# Patient Record
Sex: Female | Born: 1950 | Race: White | Hispanic: No | Smoking: Never smoker
Health system: Southern US, Community
[De-identification: ages and names within clinical notes are randomized; demographics above are authoritative.]

## PROBLEM LIST (undated history)

## (undated) DIAGNOSIS — E039 Hypothyroidism, unspecified: Secondary | ICD-10-CM

## (undated) DIAGNOSIS — E785 Hyperlipidemia, unspecified: Secondary | ICD-10-CM

## (undated) HISTORY — DX: Hyperlipidemia, unspecified: E78.5

## (undated) HISTORY — DX: Hypothyroidism, unspecified: E03.9

---

## 2018-01-27 LAB — RESULTS CONSOLE HPV: CHL HPV: NEGATIVE

## 2018-01-27 LAB — HM PAP SMEAR: HM Pap smear: NORMAL

## 2019-09-28 LAB — HM MAMMOGRAPHY

## 2019-11-06 HISTORY — PX: COLONOSCOPY: SHX174

## 2019-11-06 LAB — HM COLONOSCOPY

## 2020-09-02 ENCOUNTER — Encounter: Payer: Self-pay | Admitting: Medical

## 2020-09-02 ENCOUNTER — Ambulatory Visit (INDEPENDENT_AMBULATORY_CARE_PROVIDER_SITE_OTHER): Payer: Medicare Other | Admitting: Medical

## 2020-09-02 ENCOUNTER — Other Ambulatory Visit: Payer: Self-pay

## 2020-09-02 VITALS — BP 102/70 | HR 68 | Ht 60.0 in | Wt 109.8 lb

## 2020-09-02 DIAGNOSIS — E559 Vitamin D deficiency, unspecified: Secondary | ICD-10-CM | POA: Insufficient documentation

## 2020-09-02 DIAGNOSIS — E039 Hypothyroidism, unspecified: Secondary | ICD-10-CM | POA: Diagnosis not present

## 2020-09-02 DIAGNOSIS — K644 Residual hemorrhoidal skin tags: Secondary | ICD-10-CM

## 2020-09-02 DIAGNOSIS — R195 Other fecal abnormalities: Secondary | ICD-10-CM

## 2020-09-02 DIAGNOSIS — E785 Hyperlipidemia, unspecified: Secondary | ICD-10-CM | POA: Diagnosis not present

## 2020-09-02 HISTORY — DX: Other fecal abnormalities: R19.5

## 2020-09-02 MED ORDER — HYDROCORTISONE 2.5 % EX CREA: TOPICAL_CREAM | Freq: Two times a day (BID) | CUTANEOUS | 1 refills | Status: DC

## 2020-09-02 NOTE — Progress Notes (Signed)
Subjective:  Theresa Harrison is a 70 y.o. female who presents for Chief Complaint  Patient presents with  . Hypothyroidism     Here as a new patient today.    Moved from [place] back March 2022  Her sister in law lives here.   Lost brother in May of last year.   She was originally going to move back here anyway's, but he passed away last year.  Grew up in [place].  Son was stations in [place] in the past but he owned the land in [place].    Has hypothyroidism and her labs have varied quite a bit.  Been on thyroid medication in the 80s.   Currently on Levothyroxine .  Wants updated labs.  Hyperlipidemia - compliant with Lipitor 20mg  daily, been on this for years.  Takes vitamin D 5000 units daily.   Doesn't get a lot of weight bearing exercise.  She notes bad scoliosis , and does get pains in back and left hip bursitis.    Gets a lot of hip and back pain.  Uses tylenol arthritis occasionally by mouth.   Has hx/o hemorrhoids and prior banding.  Denies blood in stool.  Denies diarrhea, but sometimes has loose stools.  Doesn't always have formed stools.   Stool is more of a pudding consistency.    Had hand surgery in recent months and had bad reaction to ancef.  Wanted to make sure this is documented in records  No other aggravating or relieving factors.    No other c/o.  Past Medical History:  Diagnosis Date  . Hyperlipidemia   . Hypothyroid      Current Outpatient Medications on File Prior to Visit  Medication Sig Dispense Refill  . acetaminophen (TYLENOL) 500 MG tablet Take by mouth.    atorvastatin (LIPITOR) 20 MG tablet Take 1 tablet by mouth daily.    . Cholecalciferol (VITAMIN D-3) 5000 UNIT/ML LIQD Take by mouth.    . levothyroxine (SYNTHROID) 75 MCG tablet Take 75 mcg by mouth every morning.     No current facility-administered medications on file prior to visit.     The following portions of the patient's history were reviewed and  updated as appropriate: allergies, current medications, past family history, past medical history, past social history, past surgical history and problem list.  ROS Otherwise as in subjective above   Objective: BP 102/70   Pulse 68   Ht 5' (1.524 m)   Wt 109 lb 12.8 oz (49.8 kg)   SpO2 96%   BMI 21.44 kg/m   General appearance: alert, no distress, well developed, well nourished, white female Neck: supple, no lymphadenopathy, no thyromegaly, no masses Heart: RRR, normal S1, S2, no murmurs Lungs: CTA bilaterally, no wheezes, rhonchi, or rales Abdomen: +bs, soft, non tender, non distended, no masses, no hepatomegaly, no splenomegaly Significant obvious scoliosis with the right calcaneus that the Pulses: 2+ radial pulses, 2+ pedal pulses, normal cap refill Ext: no edema    Assessment: Encounter Diagnoses  Name Primary?  . Hypothyroidism (acquired) Yes  . Hyperlipidemia, unspecified hyperlipidemia type   . External hemorrhoid   . Vitamin D deficiency   . Change in consistency of stool      Plan: hypothyroidism -  discussed compliance, proper use of medication, and labs today  hyperlipidemia - c/t statin. Reviewed labs from 11/2019.   External hemorrhoids - c/t hot bath soaks, can use Hydrocortisone cream prn  Vit D deficiency - c/t supplement  Change in stool - likely related to diet.  Counseled on healthy diet with good fiber and water intake.   Reviewed 11/2019 colonoscopy report she brought showing internal and external hemorrhoids only.    I reviewed the records she brought in from prior including labs from August 2021, colonoscopy from August 2021, mammogram from June 2021  Leeona was seen today for hypothyroidism.  Diagnoses and all orders for this visit:  Hypothyroidism (acquired) -     TSH -     T4, free -     T3  Hyperlipidemia, unspecified hyperlipidemia type  External hemorrhoid  Vitamin D deficiency  Change in consistency of stool  Other  orders -     hydrocortisone 2.5 % cream; Apply topically 2 (two) times daily.    Follow up: pending labs

## 2020-09-03 ENCOUNTER — Encounter: Payer: Self-pay | Admitting: Medical

## 2020-09-03 ENCOUNTER — Other Ambulatory Visit: Payer: Self-pay | Admitting: Medical

## 2020-09-03 LAB — TSH: TSH: 4.97 u[IU]/mL — ABNORMAL HIGH (ref 0.450–4.500)

## 2020-09-03 LAB — T3: T3, Total: 101 ng/dL (ref 71–180)

## 2020-09-03 LAB — T4, FREE: Free T4: 1.23 ng/dL (ref 0.82–1.77)

## 2020-09-03 MED ORDER — LEVOTHYROXINE SODIUM 88 MCG PO TABS: 88.0000 ug | ORAL_TABLET | Freq: Every day | ORAL | 1 refills | Status: DC

## 2020-09-10 ENCOUNTER — Encounter: Payer: Self-pay | Admitting: Medical

## 2020-09-12 ENCOUNTER — Encounter: Payer: Self-pay | Admitting: Medical

## 2020-11-11 ENCOUNTER — Telehealth: Payer: Self-pay

## 2020-11-11 ENCOUNTER — Other Ambulatory Visit: Payer: Self-pay

## 2020-11-11 MED ORDER — ATORVASTATIN CALCIUM 20 MG PO TABS: 20.0000 mg | ORAL_TABLET | Freq: Every day | ORAL | 1 refills | Status: DC

## 2020-11-11 NOTE — Telephone Encounter (Signed)
Pt. Called stating she needs a refill on atorvastatin pt. Last apt was 09/02/20 and next apt is 12/25/20. She is a new pt. And he has not filled this for her before she did not need it filled at last apt.

## 2020-11-11 NOTE — Telephone Encounter (Signed)
Ok to refill this? I do not see updated lipids

## 2020-12-13 ENCOUNTER — Emergency Department (HOSPITAL_COMMUNITY): Payer: Medicare Other

## 2020-12-13 ENCOUNTER — Other Ambulatory Visit: Payer: Self-pay

## 2020-12-13 ENCOUNTER — Encounter (HOSPITAL_COMMUNITY): Payer: Self-pay | Admitting: Emergency Medicine

## 2020-12-13 ENCOUNTER — Emergency Department (HOSPITAL_COMMUNITY)
Admission: EM | Admit: 2020-12-13 | Discharge: 2020-12-13 | Disposition: A | Discharge status: Home / Self Care | Payer: Medicare Other | Attending: Emergency Medicine | Admitting: Emergency Medicine

## 2020-12-13 DIAGNOSIS — E039 Hypothyroidism, unspecified: Secondary | ICD-10-CM | POA: Insufficient documentation

## 2020-12-13 DIAGNOSIS — S0990XA Unspecified injury of head, initial encounter: Secondary | ICD-10-CM | POA: Insufficient documentation

## 2020-12-13 DIAGNOSIS — W06XXXA Fall from bed, initial encounter: Secondary | ICD-10-CM | POA: Insufficient documentation

## 2020-12-13 DIAGNOSIS — W19XXXA Unspecified fall, initial encounter: Secondary | ICD-10-CM

## 2020-12-13 DIAGNOSIS — R6884 Jaw pain: Secondary | ICD-10-CM | POA: Diagnosis not present

## 2020-12-13 IMAGING — CT CT MAXILLOFACIAL W/O CM
3 of 4 series · 16 of 47 positions shown, 19 images · non-contrast
Comparison: [DATE]

CLINICAL DATA: Fell out of bed, hit left side of head

EXAM:
CT MAXILLOFACIAL WITHOUT CONTRAST
TECHNIQUE: Multidetector CT imaging of the maxillofacial structures was
performed. Multiplanar CT image reconstructions were also generated.

[Series 3: facialbone 2.0 st · axial · 0.31mm/px · z∈[-222,-82]mm · 10 of 82 slices shown, 13 images]
[im 6/82  brain]
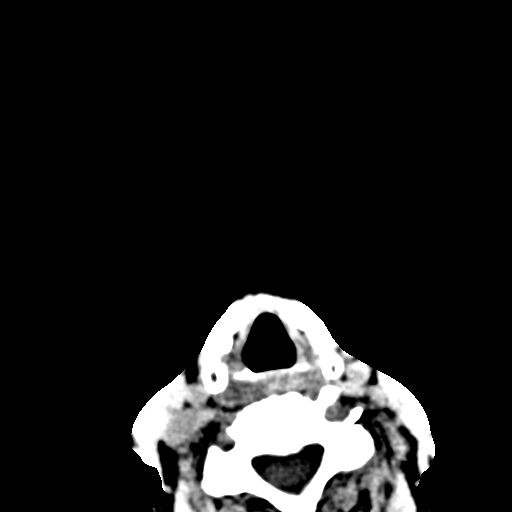
[im 6/82  bone]
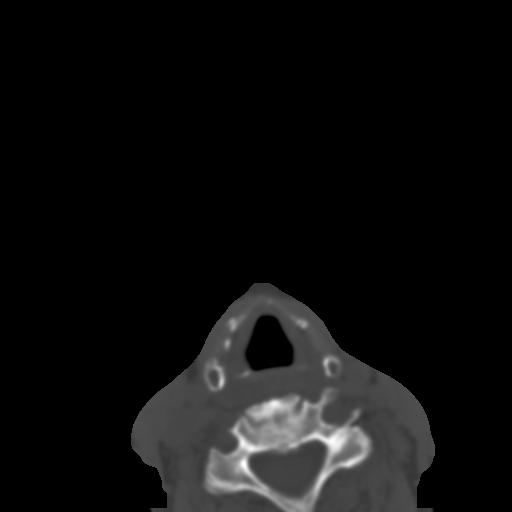
[im 14/82  bone]
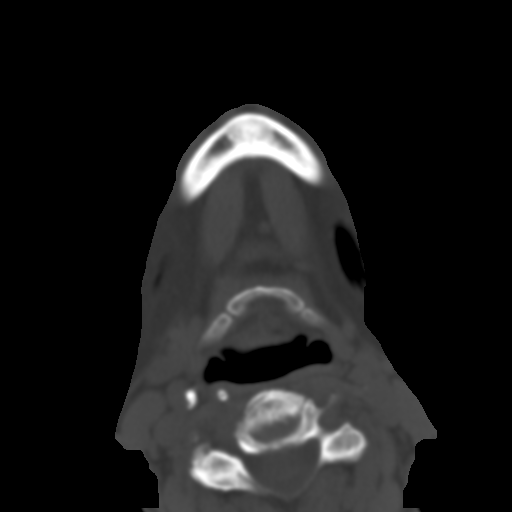
[im 23/82  bone]
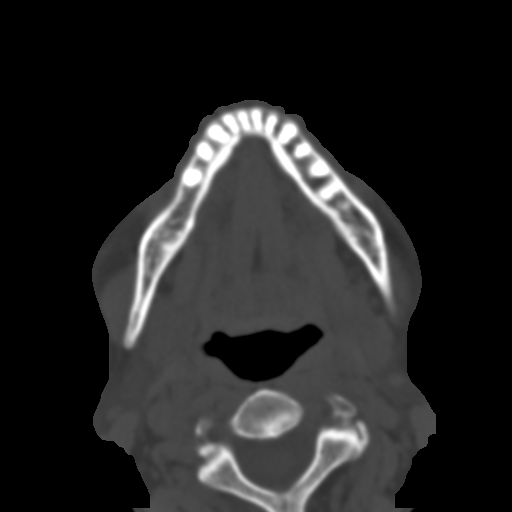
[im 28/82  bone]
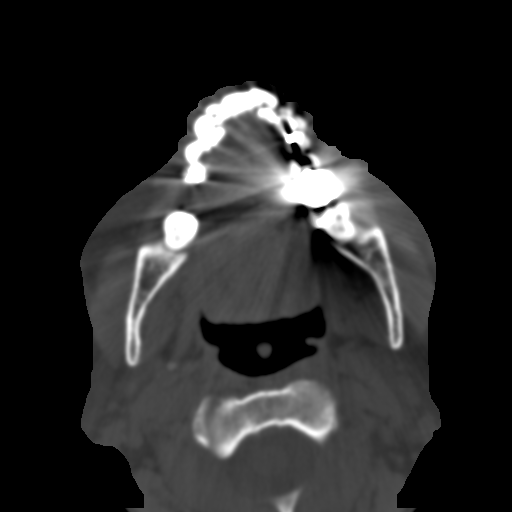
[im 37/82  brain]
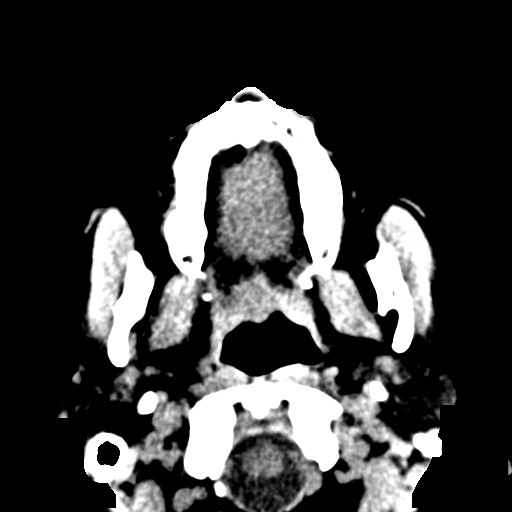
[im 37/82  bone]
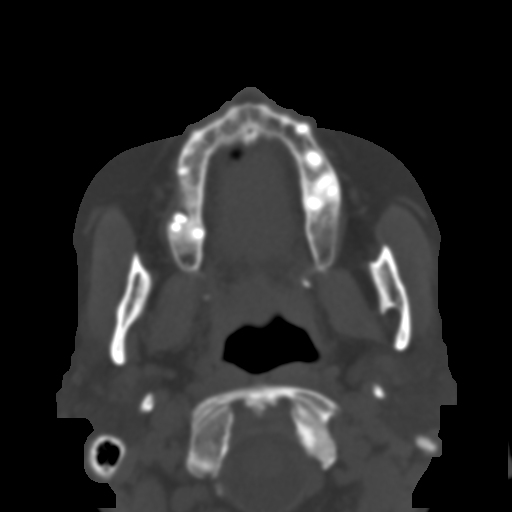
[im 45/82  bone]
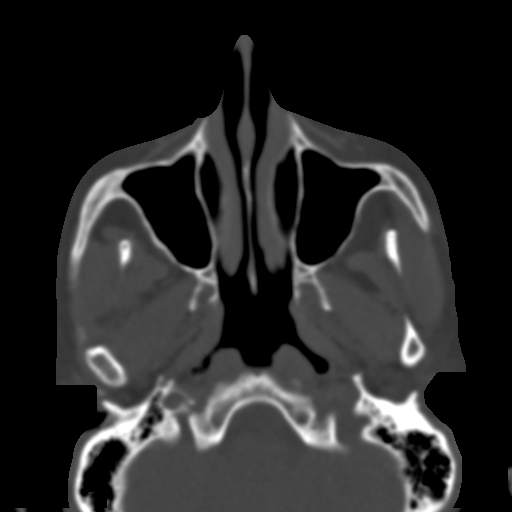
[im 54/82  bone]
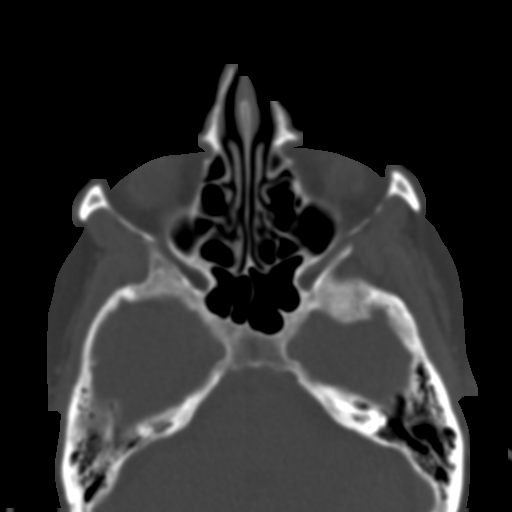
[im 62/82  bone]
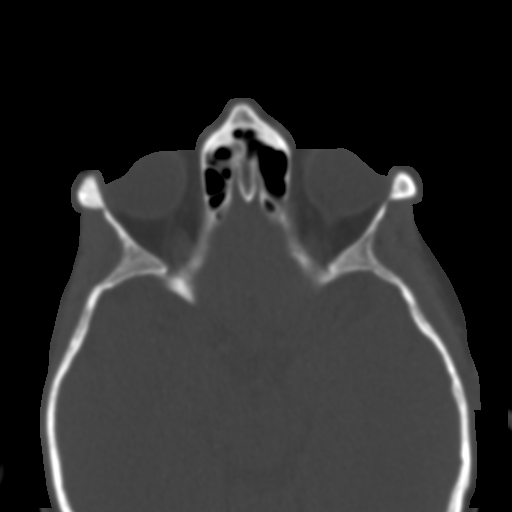
[im 68/82  brain]
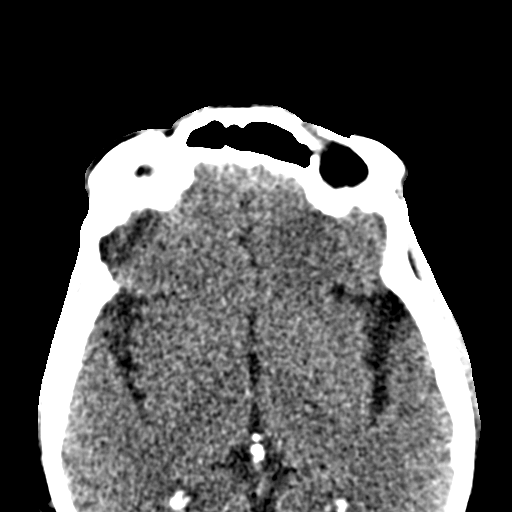
[im 68/82  bone]
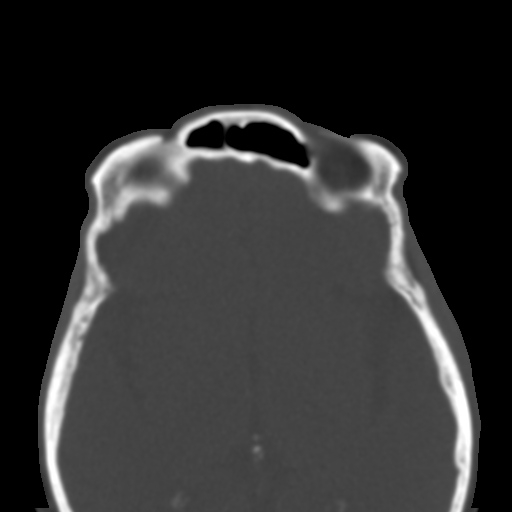
[im 76/82  bone]
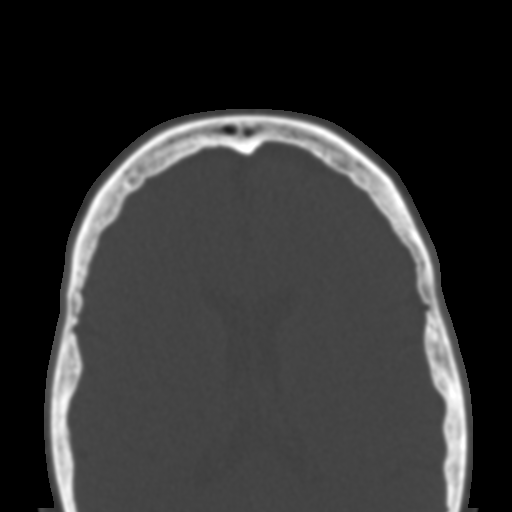

[Series 5: bone 2.0 cor · coronal · 0.38mm/px · 3 of 82 slices shown]
[im 21/82  bone]
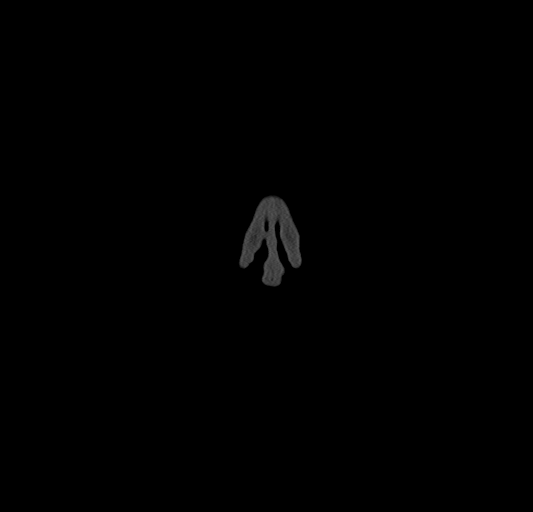
[im 41/82  bone]
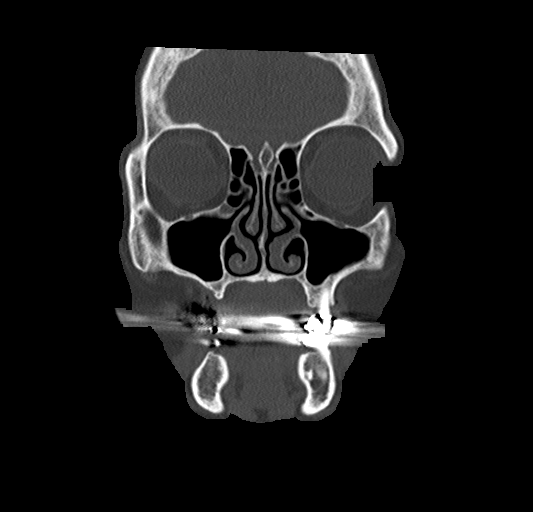
[im 61/82  bone]
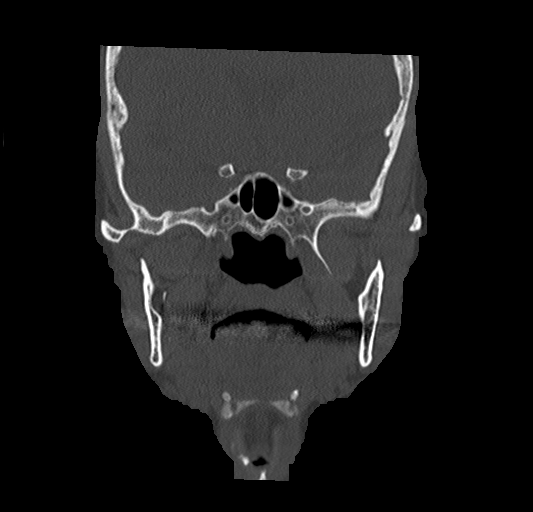

[Series 7: facialbone 2.0 sag st · sagittal · 0.34mm/px · 3 of 82 slices shown]
[im 28/82  bone]
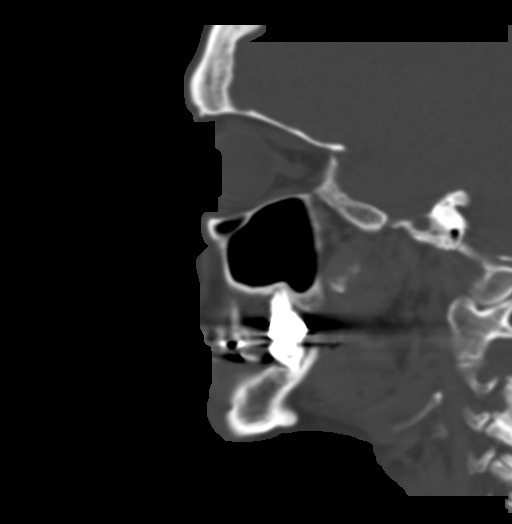
[im 41/82  bone]
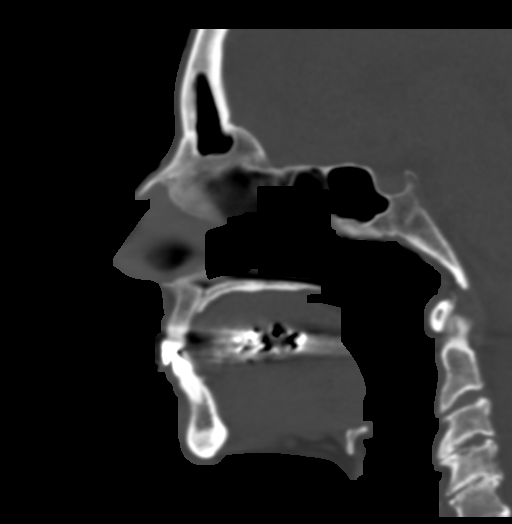
[im 55/82  bone]
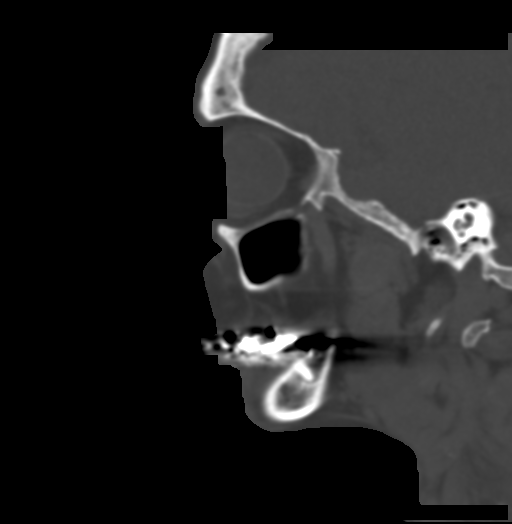

[16 of 47 positions shown; findings below may reference images not displayed]

FINDINGS: Osseous: No fracture or mandibular dislocation. No destructive
process.

Orbits: Negative. No traumatic or inflammatory finding.

Sinuses: Clear.

Soft tissues: Negative.

Limited intracranial: No significant or unexpected finding.
IMPRESSION: 1. No acute facial bone fracture.

## 2020-12-13 NOTE — Discharge Instructions (Signed)
You are seen emergency room today after a fall.  Your CT scans did not show any broken bones or bleeding.  You may take Tylenol as needed for pain and apply ice to the area that was tender.  Please follow closely with your primary care doctor.  Return to the emergency department any new or suddenly worsening symptoms.

## 2020-12-13 NOTE — ED Provider Notes (Signed)
Emergency Department Provider Note   I have reviewed the triage vital signs and the nursing notes.   HISTORY  Chief Complaint No chief complaint on file.   HPI Theresa Harrison is a 70 y.o. female with past medical history reviewed below presents emergency department with pain to the left side of the head and jaw after she rolled out of bed this morning.  Patient states the fall at a bed was mechanical.  She was supporting herself with her elbow and lying on her left side when the sheet slipped and she fell out of bed between 2 pieces of furniture.  She struck her head in the left temporal area and is having some pain in her jaw especially with touching or moving the area.  No clicking sensation.  No severe pain with eating or drinking.  She is not anticoagulated.  She not having pain in her neck or back. No numbness/weakness symptoms.    Past Medical History:  Diagnosis Date   Hyperlipidemia    Hypothyroid     Patient Active Problem List   Diagnosis Date Noted   Hypothyroidism (acquired) 09/02/2020   Hyperlipidemia 09/02/2020   External hemorrhoid 09/02/2020   Vitamin D deficiency 09/02/2020   Change in consistency of stool 09/02/2020    History reviewed. No pertinent surgical history.  Allergies Ancef [cefazolin] and Latex  No family history on file.  Social History Social History   Tobacco Use   Smoking status: Never   Smokeless tobacco: Never  Substance Use Topics   Alcohol use: Yes   Drug use: Never    Review of Systems  Constitutional: No fever/chills Eyes: No visual changes. ENT: No sore throat. Cardiovascular: Denies chest pain. Respiratory: Denies shortness of breath. Gastrointestinal: No abdominal pain.  No nausea, no vomiting.  No diarrhea.  No constipation. Genitourinary: Negative for dysuria. Musculoskeletal: Negative for back pain. Positive left jaw pain.  Skin: Negative for rash. Neurological: Negative for focal weakness or  numbness. Positive HA.   10-point ROS otherwise negative.  ____________________________________________   PHYSICAL EXAM:  VITAL SIGNS: ED Triage Vitals  Enc Vitals Group     BP 12/13/20 1211 (!) 152/80     Pulse Rate 12/13/20 1211 (!) 54     Resp 12/13/20 1211 18     Temp 12/13/20 1211 97.7 F (36.5 C)     Temp Source 12/13/20 1211 Oral     SpO2 12/13/20 1211 100 %     Weight 12/13/20 1628 112 lb (50.8 kg)     Height 12/13/20 1628 5' (1.524 m)   Constitutional: Alert and oriented. Well appearing and in no acute distress. Eyes: Conjunctivae are normal.  Head: Atraumatic appearance but some focal tenderness to the left temporal area.  Nose: No congestion/rhinnorhea. Mouth/Throat: Mucous membranes are moist.  Mild tenderness to palpation of the left lateral jaw.  Neck: No stridor. No cervical spine tenderness to palpation. Cardiovascular: Normal rate, regular rhythm. Good peripheral circulation. Grossly normal heart sounds.   Respiratory: Normal respiratory effort.  No retractions. Lungs CTAB. Gastrointestinal: Soft and nontender. No distention.  Musculoskeletal: No gross deformities of extremities. Neurologic:  Normal speech and language.  Skin:  Skin is warm, dry and intact. No rash noted.  ____________________________________________  RADIOLOGY  CT head and max-face reviewed.   ____________________________________________   PROCEDURES  Procedure(s) performed:   Procedures  None  ____________________________________________   INITIAL IMPRESSION / ASSESSMENT AND PLAN / ED COURSE  Pertinent labs & imaging results that were  available during my care of the patient were reviewed by me and considered in my medical decision making (see chart for details).   Patient presents to the emergency department for evaluation of left headache and jaw pain after fall out of bed.  Tenderness to palpation on exam but no outward signs of trauma.  Given patient's age do plan for CT  imaging of the head and max face given her lateral jaw discomfort. Will need to evaluate for fracture vs bleeding but low suspicion for both clinically.   CT imaging with no acute findings. Patient at mental status baseline. Vitals are WNL other than mildly elevated BP. Plan for return to home with PCP follow up plan.  ____________________________________________  FINAL CLINICAL IMPRESSION(S) / ED DIAGNOSES  Final diagnoses:  Injury of head, initial encounter  Jaw pain  Fall, initial encounter     Note:  This document was prepared using Dragon voice recognition software and may include unintentional dictation errors.  Alona Bene, MD, [facility] Emergency [facility]    Herson Prichard, Arlyss Repress, MD 12/17/20 0800

## 2020-12-13 NOTE — ED Triage Notes (Addendum)
Pt states she slid out of bed this morning and hit L side of head between ear and temporal area on nightstand.  Denies LOC.  Denies dizziness.  States she was reaching to get her phone with her R hand and L elbow slipped on sheet causing her to slide out of bed.  No blood thinners.

## 2020-12-13 NOTE — ED Notes (Signed)
E-signature pad unavailable at time of pt discharge. This RN discussed discharge materials with pt and answered all pt questions. Pt stated understanding of discharge material. ? ?

## 2020-12-15 ENCOUNTER — Telehealth: Payer: Self-pay

## 2020-12-15 NOTE — Telephone Encounter (Signed)
I called pt. Per pt. Ping report she went to the ER because she fell and hit her head. She said she doing fine and no f/u is needed. She also stated they did a scan of her head and everything checked out ok on that. She does have a MWV on 12/25/20.

## 2020-12-25 ENCOUNTER — Encounter: Payer: Self-pay | Admitting: Medical

## 2020-12-25 ENCOUNTER — Other Ambulatory Visit: Payer: Self-pay

## 2020-12-25 ENCOUNTER — Ambulatory Visit (INDEPENDENT_AMBULATORY_CARE_PROVIDER_SITE_OTHER): Payer: Medicare Other | Admitting: Medical

## 2020-12-25 VITALS — BP 120/72 | HR 60 | Ht 59.0 in | Wt 109.8 lb

## 2020-12-25 DIAGNOSIS — Z136 Encounter for screening for cardiovascular disorders: Secondary | ICD-10-CM | POA: Diagnosis not present

## 2020-12-25 DIAGNOSIS — E559 Vitamin D deficiency, unspecified: Secondary | ICD-10-CM | POA: Diagnosis not present

## 2020-12-25 DIAGNOSIS — K644 Residual hemorrhoidal skin tags: Secondary | ICD-10-CM

## 2020-12-25 DIAGNOSIS — Z7189 Other specified counseling: Secondary | ICD-10-CM

## 2020-12-25 DIAGNOSIS — E039 Hypothyroidism, unspecified: Secondary | ICD-10-CM | POA: Diagnosis not present

## 2020-12-25 DIAGNOSIS — E785 Hyperlipidemia, unspecified: Secondary | ICD-10-CM | POA: Diagnosis not present

## 2020-12-25 DIAGNOSIS — Z130 Encounter for screening for diseases of the blood and blood-forming organs and certain disorders involving the immune mechanism: Secondary | ICD-10-CM

## 2020-12-25 DIAGNOSIS — R159 Full incontinence of feces: Secondary | ICD-10-CM

## 2020-12-25 DIAGNOSIS — M810 Age-related osteoporosis without current pathological fracture: Secondary | ICD-10-CM | POA: Insufficient documentation

## 2020-12-25 DIAGNOSIS — Z780 Asymptomatic menopausal state: Secondary | ICD-10-CM | POA: Insufficient documentation

## 2020-12-25 DIAGNOSIS — Z0000 Encounter for general adult medical examination without abnormal findings: Secondary | ICD-10-CM | POA: Insufficient documentation

## 2020-12-25 DIAGNOSIS — R195 Other fecal abnormalities: Secondary | ICD-10-CM

## 2020-12-25 DIAGNOSIS — E2839 Other primary ovarian failure: Secondary | ICD-10-CM | POA: Insufficient documentation

## 2020-12-25 HISTORY — DX: Encounter for screening for cardiovascular disorders: Z13.6

## 2020-12-25 HISTORY — DX: Other specified counseling: Z71.89

## 2020-12-25 HISTORY — DX: Encounter for screening for diseases of the blood and blood-forming organs and certain disorders involving the immune mechanism: Z13.0

## 2020-12-25 NOTE — Patient Instructions (Signed)
This visit was a preventative care visit, also known as wellness visit or routine physical.   Topics typically include healthy lifestyle, diet, exercise, preventative care, vaccinations, sick and well care, proper use of emergency dept and after hours care, as well as other concerns.     Recommendations: Continue to return yearly for your annual wellness and preventative care visits.  This gives [place] a chance to discuss healthy lifestyle, exercise, vaccinations, review your chart record, and perform screenings where appropriate.  I recommend you see your eye doctor yearly for routine vision care.  I recommend you see your dentist yearly for routine dental care including hygiene visits twice yearly.   Vaccination recommendations were reviewed Immunization History  Administered Date(s) Administered   [employer] Vaccination 03/18/2020   Moderna Sars-Covid-2 Vaccination 05/08/2019, 06/05/2019   I recommend pneumococcal vaccine, flu vaccine, updated tetanus vaccine was greater than 10 years since last vaccine and shingles vaccine.  Please consider these    Screening for cancer: Colon cancer screening: I reviewed your colonoscopy on file that is up to date from 2021  Breast cancer screening: You should perform a self breast exam monthly.   We reviewed recommendations for regular mammograms and breast cancer screening.  Cervical cancer screening: We reviewed recommendations for pap smear screening.   Skin cancer screening: Check your skin regularly for new changes, growing lesions, or other lesions of concern Come in for evaluation if you have skin lesions of concern.  Lung cancer screening: If you have a greater than 20 pack year history of tobacco use, then you may qualify for lung cancer screening with a chest CT scan.   Please call your insurance company to inquire about coverage for this test.  We currently don't have screenings for other cancers besides breast,  cervical, colon, and lung cancers.  If you have a strong family history of cancer or have other cancer screening concerns, please let me know.    Bone health: Get at least 150 minutes of aerobic exercise weekly Get weight bearing exercise at least once weekly Bone density test:  A bone density test is an imaging test that uses a type of X-ray to measure the amount of calcium and other minerals in your bones. The test may be used to diagnose or screen you for a condition that causes weak or thin bones (osteoporosis), predict your risk for a broken bone (fracture), or determine how well your osteoporosis treatment is working. The bone density test is recommended for females 65 and older, or females or males <65 if certain risk factors such as thyroid disease, long term use of steroids such as for asthma or rheumatological issues, vitamin D deficiency, estrogen deficiency, family history of osteoporosis, self or family history of fragility fracture in first degree relative.    Heart health: Get at least 150 minutes of aerobic exercise weekly Limit alcohol It is important to maintain a healthy blood pressure and healthy cholesterol numbers  Heart disease screening: Screening for heart disease includes screening for blood pressure, fasting lipids, glucose/diabetes screening, BMI height to weight ratio, reviewed of smoking status, physical activity, and diet.    Goals include blood pressure 120/80 or less, maintaining a healthy lipid/cholesterol profile, preventing diabetes or keeping diabetes numbers under good control, not smoking or using tobacco products, exercising most days per week or at least 150 minutes per week of exercise, and eating healthy variety of fruits and vegetables, healthy oils, and avoiding unhealthy food choices like fried food,  fast food, high sugar and high cholesterol foods.    Other tests may possibly include EKG test, CT coronary calcium score, echocardiogram, exercise  treadmill stress test.     Medical care options: I recommend you continue to seek care here first for routine care.  We try really hard to have available appointments Monday through Friday daytime hours for sick visits, acute visits, and physicals.  Urgent care should be used for after hours and weekends for significant issues that cannot wait till the next day.  The emergency department should be used for significant potentially life-threatening emergencies.  The emergency department is expensive, can often have long wait times for less significant concerns, so try to utilize primary care, urgent care, or telemedicine when possible to avoid unnecessary trips to the emergency department.  Virtual visits and telemedicine have been introduced since the pandemic started in 2020, and can be convenient ways to receive medical care.  We offer virtual appointments as well to assist you in a variety of options to seek medical care.   Advanced Directives: I recommend you consider completing a Health Care Power of Attorney and Living Will.   These documents respect your wishes and help alleviate burdens on your loved ones if you were to become terminally ill or be in a position to need those documents enforced.    You can complete Advanced Directives yourself, have them notarized, then have copies made for our office, for you and for anybody you feel should have them in safe keeping.  Or, you can have an attorney prepare these documents.   If you haven't updated your Last Will and Testament in a while, it may be worthwhile having an attorney prepare these documents together and save on some costs.       Specific Concrens:  Osteoporosis Please call to schedule your bone density test  [facility]  [phone] [address] [place], [place] [address]  External hemorrhoids, fecal incontinence-referral to gastroenterology  Hyperlipidemia-continue current  medication, labs today  Hypothyroidism-continue current medications, labs today

## 2020-12-25 NOTE — Progress Notes (Signed)
Subjective:    Theresa Harrison is a 70 y.o. female who presents for Preventative Services visit and chronic medical problems/med check visit.   Chief Complaint  Patient presents with   fasting awv/ medcheck plus    Fasting awv, would like referrals-hemorrhoids, due for bone density- was on prolia but came off that due to side effect been 2-3 years since bone scan, declines flu shot    Primary Care Provider Tysinger, Kermit Balo, PA-C here for primary care  Current Health Care Team: Dentist, -none Eye doctor, Dr. Renaldo Fiddler- [place] [facility] [address]    Medical Services you may have received from other than [facility] providers in the past year (date may be approximate) none  Exercise Current exercise habits:  walking    Nutrition/Diet Current diet: in general, a "healthy" diet  , well balanced  Depression Screen Depression screen [facility] 2/9 12/25/2020  Decreased Interest 0  Down, Depressed, Hopeless 0  PHQ - 2 Score 0    Activities of Daily Living Screen/Functional Status Survey Is the patient deaf or have difficulty hearing?: No Does the patient have difficulty seeing, even when wearing glasses/contacts?: No Does the patient have difficulty concentrating, remembering, or making decisions?: No Does the patient have difficulty walking or climbing stairs?: No Does the patient have difficulty dressing or bathing?: No Does the patient have difficulty doing errands alone such as visiting a doctor's office or shopping?: No  Can patient draw a clock face showing 3:15 oclock, did 6CIT score and it was 0- which is great  Fall Risk Screen Fall Risk  12/25/2020 09/02/2020  Falls in the past year? 1 0  Number falls in past yr: 0 0  Injury with Fall? 1 0  Risk for fall due to : History of fall(s) No Fall Risks  Follow up Falls evaluation completed Falls evaluation completed    Gait Assessment: Normal gait observed yes  Advanced directives Does patient have a Health Care Power of  Attorney? No Does patient have a Living Will? Yes  Past Medical History:  Diagnosis Date   Hyperlipidemia    Hypothyroid     History reviewed. No pertinent surgical history.  Social History   Socioeconomic History   Marital status: Divorced    Spouse name: Not on file   Number of children: Not on file   Years of education: Not on file   Highest education level: Not on file  Occupational History   Not on file  Tobacco Use   Smoking status: Never   Smokeless tobacco: Never  Substance and Sexual Activity   Alcohol use: Not Currently   Drug use: Never   Sexual activity: Not on file  Other Topics Concern   Not on file  Social History Narrative   Lives alone.  Has 2 children, 2 adult sons.   Used to work in [employer], clinical for medical office for 38 years.   Walks for exercise.  12/2020.     Social Determinants of Health   Financial Resource Strain: Not on file  Food Insecurity: Not on file  Transportation Needs: Not on file  Physical Activity: Not on file  Stress: Not on file  Social Connections: Not on file  Intimate Partner Violence: Not on file    History reviewed. No pertinent family history.   Current Outpatient Medications:    acetaminophen (TYLENOL) 500 MG tablet, Take by mouth., Disp: , Rfl:    atorvastatin (LIPITOR) 20 MG tablet, Take 1 tablet (20 mg total) by  mouth daily., Disp: 90 tablet, Rfl: 1   hydrocortisone 2.5 % cream, Apply topically 2 (two) times daily., Disp: 30 g, Rfl: 1   levothyroxine (SYNTHROID) 88 MCG tablet, Take 1 tablet (88 mcg total) by mouth daily., Disp: 90 tablet, Rfl: 1  Allergies  Allergen Reactions   Ancef [Cefazolin]     Severe, hypotension   Latex Rash    History reviewed: allergies, current medications, past family history, past medical history, past social history, past surgical history and problem list  Chronic issues discussed: Ongoing problems with fecal leakages, hemorrhoids, mushy yellow seepage  from  rectum.    Acute issues discussed: none  Objective:      Biometrics BP 120/72   Pulse 60   Ht 4\' 11"  (1.499 m)   Wt 109 lb 12.8 oz (49.8 kg)   BMI 22.18 kg/m    Wt Readings from Last 3 Encounters:  12/25/20 109 lb 12.8 oz (49.8 kg)  12/13/20 112 lb (50.8 kg)  09/02/20 109 lb 12.8 oz (49.8 kg)   Alert and oriented x3, can perform simple calculations, recall is normal. geN: wd, wn, nad, white female HEENT: normocephalic, sclerae anicteric, TMs pearly, nares patent, no discharge or erythema, pharynx normal Oral cavity: MMM, no lesions Neck: supple, no lymphadenopathy, no thyromegaly, no masses, no bruits Heart: RRR, normal S1, S2, no murmurs Lungs: CTA bilaterally, no wheezes, rhonchi, or rales Abdomen: +bs, soft, non tender, non distended, no masses, no hepatomegaly, no splenomegaly Musculoskeletal: Obvious scoliosis with right side back raised higher than left, nontender, no swelling, no obvious deformity on the legs with normal range of motion, nontender, no deformity or swelling Extremities: no edema, no cyanosis, no clubbing Pulses: 2+ symmetric, upper and lower extremities, normal cap refill Neurological: alert, oriented x 3, CN2-12 intact, strength normal upper extremities and lower extremities, sensation normal throughout, DTRs 2+ throughout, no cerebellar signs, gait normal Psychiatric: normal affect, behavior normal, pleasant  Breast and pelvic declined, rectal with multiple areas of hemorrhoids and possible skin tag tissue some friable, abnormal exam, exam chaperoned by nurse    EKG indication: screen for heart disease, rate 52 bpm, PR 148 ms, QRS 76 ms, QTC 390 ms, axis 71 degrees, sinus bradycardia, possible left atrial argument    Assessment:   Encounter Diagnoses  Name Primary?   Hypothyroidism (acquired) Yes   Medicare annual wellness visit, subsequent    Vitamin D deficiency    Hyperlipidemia, unspecified hyperlipidemia type    External hemorrhoid     Change in consistency of stool    Screening for deficiency anemia    Incontinence of feces, unspecified fecal incontinence type    Screening for heart disease    Osteoporosis without current pathological fracture, unspecified osteoporosis type    Postmenopausal    Estrogen deficiency    Advanced directives, counseling/discussion      Plan:   A preventative services visit was completed today.  During the course of the visit today, we discussed and counseled about appropriate screening and preventive services.  A health risk assessment was established today that included a review of current medications, allergies, social history, family history, medical and preventative health history, biometrics, and preventative screenings to identify potential safety concerns or impairments.   This visit was a preventative care visit, also known as wellness visit or routine physical.   Topics typically include healthy lifestyle, diet, exercise, preventative care, vaccinations, sick and well care, proper use of emergency dept and after hours care, as well as other  concerns.     Recommendations: Continue to return yearly for your annual wellness and preventative care visits.  This gives [place] a chance to discuss healthy lifestyle, exercise, vaccinations, review your chart record, and perform screenings where appropriate.  I recommend you see your eye doctor yearly for routine vision care.  I recommend you see your dentist yearly for routine dental care including hygiene visits twice yearly.   Vaccination recommendations were reviewed Immunization History  Administered Date(s) Administered   [employer] Vaccination 03/18/2020   Moderna Sars-Covid-2 Vaccination 05/08/2019, 06/05/2019   I recommend pneumococcal vaccine, flu vaccine, updated tetanus vaccine was greater than 10 years since last vaccine and shingles vaccine.  Please consider these    Screening for cancer: Colon cancer  screening: I reviewed your colonoscopy on file that is up to date from 2021  Breast cancer screening: You should perform a self breast exam monthly.   We reviewed recommendations for regular mammograms and breast cancer screening.  Cervical cancer screening: We reviewed recommendations for pap smear screening.   Skin cancer screening: Check your skin regularly for new changes, growing lesions, or other lesions of concern Come in for evaluation if you have skin lesions of concern.  Lung cancer screening: If you have a greater than 20 pack year history of tobacco use, then you may qualify for lung cancer screening with a chest CT scan.   Please call your insurance company to inquire about coverage for this test.  We currently don't have screenings for other cancers besides breast, cervical, colon, and lung cancers.  If you have a strong family history of cancer or have other cancer screening concerns, please let me know.    Bone health: Get at least 150 minutes of aerobic exercise weekly Get weight bearing exercise at least once weekly Bone density test:  A bone density test is an imaging test that uses a type of X-ray to measure the amount of calcium and other minerals in your bones. The test may be used to diagnose or screen you for a condition that causes weak or thin bones (osteoporosis), predict your risk for a broken bone (fracture), or determine how well your osteoporosis treatment is working. The bone density test is recommended for females 65 and older, or females or males <65 if certain risk factors such as thyroid disease, long term use of steroids such as for asthma or rheumatological issues, vitamin D deficiency, estrogen deficiency, family history of osteoporosis, self or family history of fragility fracture in first degree relative.    Heart health: Get at least 150 minutes of aerobic exercise weekly Limit alcohol It is important to maintain a healthy blood pressure and  healthy cholesterol numbers  Heart disease screening: Screening for heart disease includes screening for blood pressure, fasting lipids, glucose/diabetes screening, BMI height to weight ratio, reviewed of smoking status, physical activity, and diet.    Goals include blood pressure 120/80 or less, maintaining a healthy lipid/cholesterol profile, preventing diabetes or keeping diabetes numbers under good control, not smoking or using tobacco products, exercising most days per week or at least 150 minutes per week of exercise, and eating healthy variety of fruits and vegetables, healthy oils, and avoiding unhealthy food choices like fried food, fast food, high sugar and high cholesterol foods.    Other tests may possibly include EKG test, CT coronary calcium score, echocardiogram, exercise treadmill stress test.     Medical care options: I recommend you continue to seek care here first for  routine care.  We try really hard to have available appointments Monday through Friday daytime hours for sick visits, acute visits, and physicals.  Urgent care should be used for after hours and weekends for significant issues that cannot wait till the next day.  The emergency department should be used for significant potentially life-threatening emergencies.  The emergency department is expensive, can often have long wait times for less significant concerns, so try to utilize primary care, urgent care, or telemedicine when possible to avoid unnecessary trips to the emergency department.  Virtual visits and telemedicine have been introduced since the pandemic started in 2020, and can be convenient ways to receive medical care.  We offer virtual appointments as well to assist you in a variety of options to seek medical care.   Advanced Directives: I recommend you consider completing a Health Care Power of Attorney and Living Will.   These documents respect your wishes and help alleviate burdens on your loved ones if  you were to become terminally ill or be in a position to need those documents enforced.    You can complete Advanced Directives yourself, have them notarized, then have copies made for our office, for you and for anybody you feel should have them in safe keeping.  Or, you can have an attorney prepare these documents.   If you haven't updated your Last Will and Testament in a while, it may be worthwhile having an attorney prepare these documents together and save on some costs.       Specific Concrens:  Osteoporosis Please call to schedule your bone density test  [facility]  [phone] [address] [place], [place] [address]  External hemorrhoids, fecal incontinence-referral to gastroenterology  Hyperlipidemia-continue current medication, labs today  Hypothyroidism-continue current medications, labs today        Theresa Harrison was seen today for fasting awv/ medcheck plus.  Diagnoses and all orders for this visit:  Hypothyroidism (acquired) -     Comprehensive metabolic panel -     CBC with Differential/Platelet -     TSH + free T4 -     DG Bone Density; Future  Medicare annual wellness visit, subsequent  Vitamin D deficiency -     CBC with Differential/Platelet -     VITAMIN D 25 Hydroxy (Vit-D Deficiency, Fractures)  Hyperlipidemia, unspecified hyperlipidemia type -     Comprehensive metabolic panel -     Lipid panel  External hemorrhoid -     Ambulatory referral to Gastroenterology  Change in consistency of stool  Screening for deficiency anemia -     CBC with Differential/Platelet  Incontinence of feces, unspecified fecal incontinence type  Screening for heart disease -     EKG 12-Lead  Osteoporosis without current pathological fracture, unspecified osteoporosis type -     DG Bone Density; Future  Postmenopausal -     DG Bone Density; Future  Estrogen deficiency -     DG Bone Density; Future  Advanced  directives, counseling/discussion     Medicare Attestation A preventative services visit was completed today.  During the course of the visit the patient was educated and counseled about appropriate screening and preventive services.  A health risk assessment was established with the patient that included a review of current medications, allergies, social history, family history, medical and preventative health history, biometrics, and preventative screenings to identify potential safety concerns or impairments.  A personalized plan was printed today for the patient's  records and use.   Personalized health advice and education was given today to reduce health risks and promote self management and wellness.  Information regarding end of life planning was discussed today.  Kristian Covey, PA-C   12/25/2020

## 2020-12-26 ENCOUNTER — Other Ambulatory Visit: Payer: Self-pay | Admitting: Medical

## 2020-12-26 LAB — COMPREHENSIVE METABOLIC PANEL
ALT: 11 IU/L (ref 0–32)
AST: 23 IU/L (ref 0–40)
Albumin/Globulin Ratio: 2.7 — ABNORMAL HIGH (ref 1.2–2.2)
Albumin: 4.9 g/dL — ABNORMAL HIGH (ref 3.7–4.7)
Alkaline Phosphatase: 99 IU/L (ref 44–121)
BUN/Creatinine Ratio: 14 (ref 12–28)
BUN: 10 mg/dL (ref 8–27)
Bilirubin Total: 0.5 mg/dL (ref 0.0–1.2)
CO2: 26 mmol/L (ref 20–29)
Calcium: 10.3 mg/dL (ref 8.7–10.3)
Chloride: 103 mmol/L (ref 96–106)
Creatinine, Ser: 0.7 mg/dL (ref 0.57–1.00)
Globulin, Total: 1.8 g/dL (ref 1.5–4.5)
Glucose: 97 mg/dL (ref 65–99)
Potassium: 4.2 mmol/L (ref 3.5–5.2)
Sodium: 143 mmol/L (ref 134–144)
Total Protein: 6.7 g/dL (ref 6.0–8.5)
eGFR: 92 mL/min/{1.73_m2} (ref >59)

## 2020-12-26 LAB — LIPID PANEL
Chol/HDL Ratio: 2.5 ratio (ref 0.0–4.4)
Cholesterol, Total: 203 mg/dL — ABNORMAL HIGH (ref 100–199)
HDL: 82 mg/dL (ref >39)
LDL Chol Calc (NIH): 97 mg/dL (ref 0–99)
Triglycerides: 142 mg/dL (ref 0–149)
VLDL Cholesterol Cal: 24 mg/dL (ref 5–40)

## 2020-12-26 LAB — CBC WITH DIFFERENTIAL/PLATELET
Basophils Absolute: 0.1 10*3/uL (ref 0.0–0.2)
Basos: 1 %
EOS (ABSOLUTE): 0 10*3/uL (ref 0.0–0.4)
Eos: 1 %
Hematocrit: 43.6 % (ref 34.0–46.6)
Hemoglobin: 14.7 g/dL (ref 11.1–15.9)
Immature Grans (Abs): 0 10*3/uL (ref 0.0–0.1)
Immature Granulocytes: 0 %
Lymphocytes Absolute: 1.9 10*3/uL (ref 0.7–3.1)
Lymphs: 28 %
MCH: 32 pg (ref 26.6–33.0)
MCHC: 33.7 g/dL (ref 31.5–35.7)
MCV: 95 fL (ref 79–97)
Monocytes Absolute: 0.5 10*3/uL (ref 0.1–0.9)
Monocytes: 7 %
Neutrophils Absolute: 4.5 10*3/uL (ref 1.4–7.0)
Neutrophils: 63 %
Platelets: 239 10*3/uL (ref 150–450)
RBC: 4.6 x10E6/uL (ref 3.77–5.28)
RDW: 12.1 % (ref 11.7–15.4)
WBC: 7 10*3/uL (ref 3.4–10.8)

## 2020-12-26 LAB — TSH+FREE T4
Free T4: 1.82 ng/dL — ABNORMAL HIGH (ref 0.82–1.77)
TSH: 0.139 u[IU]/mL — ABNORMAL LOW (ref 0.450–4.500)

## 2020-12-26 LAB — VITAMIN D 25 HYDROXY (VIT D DEFICIENCY, FRACTURES): Vit D, 25-Hydroxy: 74.5 ng/mL (ref 30.0–100.0)

## 2020-12-26 MED ORDER — LEVOTHYROXINE SODIUM 75 MCG PO TABS: 75.0000 ug | ORAL_TABLET | Freq: Every day | ORAL | 1 refills | Status: DC

## 2021-01-08 ENCOUNTER — Other Ambulatory Visit: Payer: Self-pay

## 2021-01-08 ENCOUNTER — Ambulatory Visit
Admission: RE | Admit: 2021-01-08 | Discharge: 2021-01-08 | Disposition: A | Discharge status: Home / Self Care | Payer: Medicare Other | Source: Ambulatory Visit | Attending: Medical | Admitting: Medical

## 2021-01-08 DIAGNOSIS — Z780 Asymptomatic menopausal state: Secondary | ICD-10-CM

## 2021-01-08 DIAGNOSIS — M810 Age-related osteoporosis without current pathological fracture: Secondary | ICD-10-CM

## 2021-01-08 DIAGNOSIS — E2839 Other primary ovarian failure: Secondary | ICD-10-CM

## 2021-01-08 DIAGNOSIS — E039 Hypothyroidism, unspecified: Secondary | ICD-10-CM

## 2021-01-14 ENCOUNTER — Other Ambulatory Visit: Payer: Self-pay | Admitting: Medical

## 2021-01-14 MED ORDER — ALENDRONATE SODIUM 70 MG PO TABS: 70.0000 mg | ORAL_TABLET | ORAL | 1 refills | Status: DC

## 2021-01-15 ENCOUNTER — Other Ambulatory Visit: Payer: Self-pay | Admitting: Internal Medicine

## 2021-01-15 DIAGNOSIS — M810 Age-related osteoporosis without current pathological fracture: Secondary | ICD-10-CM

## 2021-01-28 ENCOUNTER — Other Ambulatory Visit: Payer: Self-pay

## 2021-01-28 ENCOUNTER — Ambulatory Visit (INDEPENDENT_AMBULATORY_CARE_PROVIDER_SITE_OTHER): Payer: Medicare Other

## 2021-01-28 ENCOUNTER — Telehealth: Payer: Self-pay | Admitting: Internal Medicine

## 2021-01-28 DIAGNOSIS — Z23 Encounter for immunization: Secondary | ICD-10-CM | POA: Diagnosis not present

## 2021-01-28 NOTE — Progress Notes (Signed)
Per shane. Ok to give moderna covid early since flu shot was last week

## 2021-01-28 NOTE — Telephone Encounter (Signed)
Pt wants to come in in 2 weeks for pneumonia shot. Can you advise which one to give

## 2021-01-29 NOTE — Telephone Encounter (Signed)
Noted when she schedules we will know what she gets

## 2021-02-10 ENCOUNTER — Other Ambulatory Visit: Payer: Self-pay | Admitting: Medical

## 2021-02-12 ENCOUNTER — Other Ambulatory Visit: Payer: Self-pay

## 2021-02-12 ENCOUNTER — Other Ambulatory Visit (INDEPENDENT_AMBULATORY_CARE_PROVIDER_SITE_OTHER): Payer: Medicare Other

## 2021-02-12 DIAGNOSIS — Z23 Encounter for immunization: Secondary | ICD-10-CM | POA: Diagnosis not present

## 2021-02-27 ENCOUNTER — Other Ambulatory Visit: Payer: Self-pay | Admitting: Medical

## 2021-03-11 ENCOUNTER — Other Ambulatory Visit: Payer: Self-pay | Admitting: Medical

## 2021-04-09 ENCOUNTER — Other Ambulatory Visit: Payer: Self-pay | Admitting: Medical

## 2021-04-22 ENCOUNTER — Other Ambulatory Visit: Payer: Self-pay | Admitting: Medical

## 2021-04-22 ENCOUNTER — Telehealth: Payer: Self-pay | Admitting: Medical

## 2021-04-22 MED ORDER — LEVOTHYROXINE SODIUM 75 MCG PO TABS: 75.0000 ug | ORAL_TABLET | Freq: Every day | ORAL | 1 refills | Status: DC

## 2021-04-22 NOTE — Telephone Encounter (Signed)
sent 

## 2021-04-22 NOTE — Telephone Encounter (Signed)
Pt called and is requesting a refill on her synthroid 75mg  please send to the [facility] #[id number] [address], [place]

## 2021-05-08 ENCOUNTER — Other Ambulatory Visit: Payer: Self-pay | Admitting: Medical

## 2021-05-12 NOTE — Telephone Encounter (Signed)
Referred to a different endocrinologist in town if she is okay with this.  Try [employer]

## 2021-06-30 ENCOUNTER — Other Ambulatory Visit: Payer: Self-pay

## 2021-06-30 ENCOUNTER — Encounter: Payer: Self-pay | Admitting: Medical

## 2021-06-30 ENCOUNTER — Ambulatory Visit (INDEPENDENT_AMBULATORY_CARE_PROVIDER_SITE_OTHER): Payer: Medicare Other | Admitting: Medical

## 2021-06-30 VITALS — BP 120/70 | HR 57 | Wt 116.4 lb

## 2021-06-30 DIAGNOSIS — E039 Hypothyroidism, unspecified: Secondary | ICD-10-CM

## 2021-06-30 DIAGNOSIS — R899 Unspecified abnormal finding in specimens from other organs, systems and tissues: Secondary | ICD-10-CM

## 2021-06-30 DIAGNOSIS — M810 Age-related osteoporosis without current pathological fracture: Secondary | ICD-10-CM | POA: Diagnosis not present

## 2021-06-30 DIAGNOSIS — E785 Hyperlipidemia, unspecified: Secondary | ICD-10-CM | POA: Diagnosis not present

## 2021-06-30 HISTORY — DX: Unspecified abnormal finding in specimens from other organs, systems and tissues: R89.9

## 2021-06-30 NOTE — Patient Instructions (Signed)
Synthroid name brand thyroid medication is what we prefer due to inconsistencies with generic levothyroxine regarding symptoms and effectiveness of the medication.   One cheaper option for getting name brand Synthroid is a Health visitor order pharmacy called [facility] in [place].    ? ? ?

## 2021-06-30 NOTE — Progress Notes (Signed)
Subjective: ? Theresa Harrison is a 71 y.o. female who presents for ?Chief Complaint  ?Patient presents with  ? follow-up  ?  Follow-up on Thyroid medication. But would like atorvastatin  #90   ?   ?Here for med check ? ?Here for rehceck on thyroid medication.    There apparently was confusion about dosing last visit in September 2022.  We had written down that she was taking 88 mcg daily but she notes that somehow that was incorrect and she has actually been on 75 mcg regularly.  She is taking generic levothyroxine.  She feels fine in general.  The highest that she has ever been on is 88 mcg in the past. ? ?Osteoporosis-last visit we made a referral but the office apparently is no longer taking patients.  She has been on Fosamax and Prolia in the past, has also been on Reclast in the past.  She had lost some teeth while on Prolia so she does not want to use any bisphosphonate due to risk of osteonecrosis of the jaw.  She had gastric issues with Fosamax. ? ?She is a non-smoker ? ?She has chronic dry eyes, she is on therapy parotid ductal ? ?She saw her spine specialist recently. ? ?Wants 90 day supply on both cholesterol and thyroid medication. ? ?Having some sneezing and allergy issues, mild.  ? ?No other aggravating or relieving factors.   ? ?No other c/o. ? ?Past Medical History:  ?Diagnosis Date  ? Hyperlipidemia   ? Hypothyroid   ? ?Current Outpatient Medications on File Prior to Visit  ?Medication Sig Dispense Refill  ? acetaminophen (TYLENOL) 500 MG tablet Take by mouth.    ? atorvastatin (LIPITOR) 20 MG tablet TAKE 1 TABLET BY MOUTH EVERY DAY 90 tablet 1  ? levothyroxine (SYNTHROID) 75 MCG tablet Take 1 tablet (75 mcg total) by mouth daily. 30 tablet 1  ? hydrocortisone 2.5 % cream Apply topically 2 (two) times daily. (Patient not taking: Reported on 06/30/2021) 30 g 1  ? ?No current facility-administered medications on file prior to visit.  ? ? ? ?The following portions of the patient's history were  reviewed and updated as appropriate: allergies, current medications, past family history, past medical history, past social history, past surgical history and problem list. ? ?ROS ?Otherwise as in subjective above ? ? ? ?Objective: ?BP 120/70   Pulse (!) 57   Wt 116 lb 6.4 oz (52.8 kg)   BMI 23.51 kg/m?  ? ?Wt Readings from Last 3 Encounters:  ?06/30/21 116 lb 6.4 oz (52.8 kg)  ?12/25/20 109 lb 12.8 oz (49.8 kg)  ?12/13/20 112 lb (50.8 kg)  ? ?BP Readings from Last 3 Encounters:  ?06/30/21 120/70  ?12/25/20 120/72  ?12/13/20 (!) 142/93  ? ?General appearance: alert, no distress, well developed, well nourished ? ? ? ?Assessment: ?Encounter Diagnoses  ?Name Primary?  ? Hypothyroidism (acquired) Yes  ? Hyperlipidemia, unspecified hyperlipidemia type   ? Abnormal laboratory test   ? ? ? ?Plan: ?Hypothyroidism-last visit we had taken an information that she was on levothyroxine 88 mcg daily.  When in fact she was on 75 mcg daily.  So that she actually has been on the same dose since last visit.  Recheck labs today but likely will need to lower dose to 50 mcg daily.  I gave her a sample of 50 mcg Synthroid so she is not completely out of her medication. ? ?Hyperlipidemia-continue statin ? ?Elevated albumin on labs back in September.  Recheck labs today. ? ?Osteoporosis-discussed options for therapy.  We made a referral to endocrinology last visit but the office in which we referred to apparently is not taking the patient's although they initially led her to believe they were.  She will look over the handout I gave today on treatment options.  She has been on Fosamax and Prolia in the past but does not want to use a bisphosphonate due to osteonecrosis risk.  She wants to use some other treatment.  She is also been on Reclast infusion in the past.  We discussed importance of weightbearing and aerobic exercise. ? ? ?Theresa Harrison was seen today for follow-up. ? ?Diagnoses and all orders for this visit: ? ?Hypothyroidism  (acquired) ?-     TSH + free T4 ? ?Hyperlipidemia, unspecified hyperlipidemia type ?-     Hepatic function panel ? ?Abnormal laboratory test ?-     Hepatic function panel ? ? ? ?Follow up: pending labs ? ? ?

## 2021-07-01 ENCOUNTER — Other Ambulatory Visit: Payer: Self-pay | Admitting: Medical

## 2021-07-01 LAB — TSH+FREE T4
Free T4: 1.34 ng/dL (ref 0.82–1.77)
TSH: 2.38 u[IU]/mL (ref 0.450–4.500)

## 2021-07-01 LAB — HEPATIC FUNCTION PANEL
ALT: 14 IU/L (ref 0–32)
AST: 25 IU/L (ref 0–40)
Albumin: 4.8 g/dL — ABNORMAL HIGH (ref 3.7–4.7)
Alkaline Phosphatase: 98 IU/L (ref 44–121)
Bilirubin Total: 0.4 mg/dL (ref 0.0–1.2)
Bilirubin, Direct: 0.11 mg/dL (ref 0.00–0.40)
Total Protein: 6.7 g/dL (ref 6.0–8.5)

## 2021-07-01 MED ORDER — ATORVASTATIN CALCIUM 20 MG PO TABS: 20.0000 mg | ORAL_TABLET | Freq: Every day | ORAL | 3 refills | Status: DC

## 2021-07-01 MED ORDER — LEVOTHYROXINE SODIUM 75 MCG PO TABS: 75.0000 ug | ORAL_TABLET | Freq: Every day | ORAL | 3 refills | Status: DC

## 2021-07-02 ENCOUNTER — Other Ambulatory Visit: Payer: Self-pay | Admitting: Medical

## 2021-07-02 MED ORDER — LEVOTHYROXINE SODIUM 75 MCG PO TABS: 75.0000 ug | ORAL_TABLET | Freq: Every day | ORAL | 3 refills | Status: DC

## 2021-07-02 MED ORDER — ATORVASTATIN CALCIUM 20 MG PO TABS
20.0000 mg | ORAL_TABLET | Freq: Every day | ORAL | 3 refills | Status: DC
Start: 2021-07-02 — End: 2022-05-19

## 2021-08-19 NOTE — Congregational Nurse Program (Signed)
?  Dept: [phone] ? ? ?Congregational Nurse Program Note ? ?Date of Encounter: 08/19/2021 ?BP 133/72  ?Patient requested bp check ? ?Past Medical History: ?Past Medical History:  ?Diagnosis Date  ? Hyperlipidemia   ? Hypothyroid   ? ? ?Encounter Details: ? CNP Questionnaire - 08/19/21 1628   ? ?  ? Questionnaire  ? Do you give verbal consent to treat you today? Yes   ? Location Patient [place]   ? Visit Setting Church or Organization   ? Insurance Referral N/A   ? Medication N/A   ? Medical Provider Yes   ? Medical Referral N/A   ? Food N/A   ? Transportation N/A   ? Housing/Utilities N/A   ? Interpersonal Safety N/A   ? Intervention Blood pressure   ? ED Visit Averted N/A   ? Life-Saving Intervention Made N/A   ? ?  ?  ? ?  ? ? ?Estanislado Pandy, RN,  ? ?

## 2021-10-12 ENCOUNTER — Ambulatory Visit (INDEPENDENT_AMBULATORY_CARE_PROVIDER_SITE_OTHER): Payer: Medicare Other | Admitting: Medical

## 2021-10-12 VITALS — BP 120/80 | HR 60 | Wt 113.8 lb

## 2021-10-12 DIAGNOSIS — R42 Dizziness and giddiness: Secondary | ICD-10-CM | POA: Insufficient documentation

## 2021-10-12 DIAGNOSIS — E039 Hypothyroidism, unspecified: Secondary | ICD-10-CM | POA: Diagnosis not present

## 2021-10-12 DIAGNOSIS — R001 Bradycardia, unspecified: Secondary | ICD-10-CM

## 2021-10-12 DIAGNOSIS — H6121 Impacted cerumen, right ear: Secondary | ICD-10-CM

## 2021-10-12 HISTORY — DX: Dizziness and giddiness: R42

## 2021-10-12 NOTE — Progress Notes (Signed)
Subjective:  Theresa Harrison is a 71 y.o. female who presents for Chief Complaint  Patient presents with   Acute Visit    Light headed and dizzy, mainly when she gets up. Not sure if its low blood sugar or blood pressure. She stated that she is also dehydrated.      Here for concerns for lightheadedness when she scheduled the appt a few weeks ago.   Getting up from cough or salon chair will feel lightheaded. However, this past week she has felt fine.  Feels like she is drinking plenty of fluids and water.  Been using some Gatorade hydration drinks.   Is exercising as usual.  She notes that she nibbles, not 3 meals daily.  Nibbles on foods throughout the day. Generally doesn't eat breakfast.    With dizziness, no chest pain, no palpitations.   Does get some ringing the ears, but has hx/o wax buildup in the ears.  Hearing not worse of late.   However, eyes have bothered her some of late.  Has very dry eyes, but no dry mouth. Uses eye drops at least twice daily.  No other aggravating or relieving factors.    No other c/o.  Past Medical History:  Diagnosis Date   Hyperlipidemia    Hypothyroid    Current Outpatient Medications on File Prior to Visit  Medication Sig Dispense Refill   acetaminophen (TYLENOL) 500 MG tablet Take by mouth.     atorvastatin (LIPITOR) 20 MG tablet Take 1 tablet (20 mg total) by mouth daily. 90 tablet 3   Cholecalciferol (VITAMIN D3) 10 MCG (400 UNIT) tablet Take 400 Units by mouth daily.     levothyroxine (SYNTHROID) 75 MCG tablet Take 1 tablet (75 mcg total) by mouth daily. 90 tablet 3   hydrocortisone 2.5 % cream Apply topically 2 (two) times daily. (Patient not taking: Reported on 06/30/2021) 30 g 1   No current facility-administered medications on file prior to visit.   No past surgical history on file.  The following portions of the patient's history were reviewed and updated as appropriate: allergies, current medications, past family history,  past medical history, past social history, past surgical history and problem list.  ROS Otherwise as in subjective above     Objective: BP 120/80   Pulse 60   Wt 113 lb 12.8 oz (51.6 kg)   SpO2 98%   BMI 22.98 kg/m  Wt Readings from Last 3 Encounters:  10/12/21 113 lb 12.8 oz (51.6 kg)  06/30/21 116 lb 6.4 oz (52.8 kg)  12/25/20 109 lb 12.8 oz (49.8 kg)   BP Readings from Last 3 Encounters:  10/12/21 120/80  08/19/21 133/72  06/30/21 120/70     General appearance: alert, no distress, well developed, well nourished HEENT: normocephalic, sclerae anicteric, conjunctiva pink and moist, left TM pearly, right canal with impacted cerumen, nares patent, no discharge or erythema, pharynx normal Oral cavity: MMM, no lesions Neck: supple, no lymphadenopathy, no thyromegaly, no masses, no bruits or JVD Heart: RRR, normal S1, S2, no murmurs Lungs: CTA bilaterally, no wheezes, rhonchi, or rales Neuro: CN2-12 intact, nonfocal exam Pulses: 2+ radial pulses, 2+ pedal pulses, normal cap refill Ext: no edema    Assessment: Encounter Diagnoses  Name Primary?   Lightheaded Yes   Dizziness    Impacted cerumen of right ear    Hypothyroidism, unspecified type    Bradycardia      Plan: Discussed potential causes of dizzines and lightheaded .  She is  not drinking enough water daily, so advised increased hydration in general, labs to further evaluate today.  Bradycardia - consider baseline cardiology consult  Impacted cerumen - she was here at the very end of the day.  She will return on a different day for ear wax removal by lavage  Hypothyroidism - updated labs today, c/t current medication  Juletta was seen today for acute visit.  Diagnoses and all orders for this visit:  Lightheaded -     CBC -     Basic metabolic panel -     Orthostatic vital signs  Dizziness -     CBC -     Basic metabolic panel -     Orthostatic vital signs  Impacted cerumen of right ear -      CBC  Hypothyroidism, unspecified type -     TSH + free T4 -     CBC  Bradycardia    Follow up: pending labs

## 2021-10-13 ENCOUNTER — Other Ambulatory Visit: Payer: Self-pay | Admitting: Medical

## 2021-10-13 LAB — BASIC METABOLIC PANEL
BUN/Creatinine Ratio: 20 (ref 12–28)
BUN: 13 mg/dL (ref 8–27)
CO2: 25 mmol/L (ref 20–29)
Calcium: 9.8 mg/dL (ref 8.7–10.3)
Chloride: 105 mmol/L (ref 96–106)
Creatinine, Ser: 0.66 mg/dL (ref 0.57–1.00)
Glucose: 95 mg/dL (ref 70–99)
Potassium: 4.4 mmol/L (ref 3.5–5.2)
Sodium: 143 mmol/L (ref 134–144)
eGFR: 93 mL/min/{1.73_m2} (ref >59)

## 2021-10-13 LAB — CBC
Hematocrit: 42 % (ref 34.0–46.6)
Hemoglobin: 13.9 g/dL (ref 11.1–15.9)
MCH: 30.8 pg (ref 26.6–33.0)
MCHC: 33.1 g/dL (ref 31.5–35.7)
MCV: 93 fL (ref 79–97)
Platelets: 248 10*3/uL (ref 150–450)
RBC: 4.52 x10E6/uL (ref 3.77–5.28)
RDW: 13.3 % (ref 11.7–15.4)
WBC: 6.2 10*3/uL (ref 3.4–10.8)

## 2021-10-13 LAB — TSH+FREE T4
Free T4: 1.38 ng/dL (ref 0.82–1.77)
TSH: 1.16 u[IU]/mL (ref 0.450–4.500)

## 2021-11-02 ENCOUNTER — Ambulatory Visit (INDEPENDENT_AMBULATORY_CARE_PROVIDER_SITE_OTHER): Payer: Medicare Other | Admitting: Medical

## 2021-11-02 VITALS — BP 110/68 | HR 62 | Wt 114.0 lb

## 2021-11-02 DIAGNOSIS — H6121 Impacted cerumen, right ear: Secondary | ICD-10-CM | POA: Diagnosis not present

## 2021-11-02 DIAGNOSIS — H938X3 Other specified disorders of ear, bilateral: Secondary | ICD-10-CM | POA: Diagnosis not present

## 2021-11-02 DIAGNOSIS — H9190 Unspecified hearing loss, unspecified ear: Secondary | ICD-10-CM | POA: Diagnosis not present

## 2021-11-02 DIAGNOSIS — L989 Disorder of the skin and subcutaneous tissue, unspecified: Secondary | ICD-10-CM | POA: Diagnosis not present

## 2021-11-02 NOTE — Progress Notes (Signed)
Subjective:   Here for complaint of ear fullness, decreased hearing.   Also has some skin lesions of concern.  No other aggravating or relieving factors.  No other complaint.  Review of Systems Constitutional: denies fever, chills, sweats ENT: no runny nose, ear pain, sore throat, hoarseness, sinus pain, teeth pain, tinnitus, hearing loss Gastroenterology: denies nausea, vomiting     Objective:   Physical Exam  General appearance: alert, no distress, WD/WN Ears: right ear canal with impacted cerumen, left TM and canal normal HENT: conjunctiva/corneas normal, sclerae anicteric, nares patent, no discharge or erythema, pharynx normal Oral cavity: MMM, tongue normal, teeth normal Neurological: Hearing normal bilaterally to whisper Skin: right low back with slight raised roundish crusty lesions, flesh colored to light brown color approx 78mm diameter, left upper back with 67mm round pink/brown somewhat rough lesions and smaller 43mm similar pink brown lesion approximately 0.5cm inferior to other lesion     Assessment & Plan:    Encounter Diagnoses  Name Primary?   Ear fullness, bilateral Yes   Skin lesion    Impacted cerumen, right ear    Decreased hearing, unspecified laterality      Discussed findings.  Discussed risk/benefits of procedure and patient agrees to procedure.  Despite our best efforts we only got a little bit of cerumen out of the right ear.  Referral to ENT for further management.      Skin lesion -we discussed options for treating the right lower back skin lesion.  She opted for cryotherapy.  Cryotherapy performed.  We discussed wound care.  She is going to a wedding this weekend and did not want to do cryotherapy to the upper back lesions.  She can return at her convenience for those.  Theresa Harrison was seen today for ear wax removal.  Diagnoses and all orders for this visit:  Ear fullness, bilateral -     Ambulatory referral to ENT  Skin lesion  Impacted cerumen,  right ear -     Ambulatory referral to ENT  Decreased hearing, unspecified laterality    Follow up - 50mo

## 2021-11-02 NOTE — Addendum Note (Signed)
Addended by: Jac Canavan on: 11/02/2021 01:03 PM   Modules accepted: Level of Service

## 2021-11-11 NOTE — Congregational Nurse Program (Signed)
  Dept: [phone]   Congregational Nurse Program Note  Date of Encounter: 11/11/2021  BP 124/79 (BP Location: Left Arm)   Pulse 83  Pt to [facility] nurse clinic for bp check. Pt states donated blood yesterday and feels a little dehydrated; skin with poor turgor, she walked 1/2 mile to clinic and it's hot; states her pulse is higher than normal. Encouraged hydration and continue good self care. Overall pt seems to be in good health and spirits.  [name], RN, CCNP   Past Medical History: Past Medical History:  Diagnosis Date   Hyperlipidemia    Hypothyroid     Encounter Details:  CNP Questionnaire - 11/11/21 1535       Questionnaire   Do you give verbal consent to treat you today? Yes    Location Patient Served  [name]    Visit Setting [place] or Organization    Patient Status Unknown    Insurance Unknown    Insurance Referral N/A    Medication N/A    Medical Provider Yes    Screening Referrals N/A    Medical Referral N/A    Medical Appointment Made N/A    Food N/A    Transportation N/A    Housing/Utilities N/A    Interpersonal Safety N/A    Intervention Blood pressure;Navigate Healthcare System;Support;Educate    ED Visit Averted N/A    Life-Saving Intervention Made N/A

## 2021-11-13 ENCOUNTER — Telehealth: Payer: Self-pay

## 2021-11-13 NOTE — Telephone Encounter (Signed)
Pt. Called to check on two referrals that were put in for her back on 11/02/21. I saw the ENT referral in the system and I am having Caryn check on that one. She also mentioned a derm referral but I did not see a derm referral in the system for her.

## 2021-11-20 ENCOUNTER — Encounter: Payer: Self-pay | Admitting: Internal Medicine

## 2021-12-01 ENCOUNTER — Ambulatory Visit (INDEPENDENT_AMBULATORY_CARE_PROVIDER_SITE_OTHER): Payer: Medicare Other | Admitting: Medical

## 2021-12-01 ENCOUNTER — Ambulatory Visit
Admission: RE | Admit: 2021-12-01 | Discharge: 2021-12-01 | Disposition: A | Discharge status: Home / Self Care | Payer: Medicare Other | Source: Ambulatory Visit | Attending: Medical | Admitting: Medical

## 2021-12-01 ENCOUNTER — Encounter: Payer: Self-pay | Admitting: Medical

## 2021-12-01 VITALS — BP 138/78 | HR 64 | Ht 59.5 in | Wt 114.0 lb

## 2021-12-01 DIAGNOSIS — L989 Disorder of the skin and subcutaneous tissue, unspecified: Secondary | ICD-10-CM | POA: Diagnosis not present

## 2021-12-01 DIAGNOSIS — R0609 Other forms of dyspnea: Secondary | ICD-10-CM | POA: Insufficient documentation

## 2021-12-01 DIAGNOSIS — E039 Hypothyroidism, unspecified: Secondary | ICD-10-CM

## 2021-12-01 DIAGNOSIS — E860 Dehydration: Secondary | ICD-10-CM | POA: Diagnosis not present

## 2021-12-01 DIAGNOSIS — R001 Bradycardia, unspecified: Secondary | ICD-10-CM

## 2021-12-01 DIAGNOSIS — L988 Other specified disorders of the skin and subcutaneous tissue: Secondary | ICD-10-CM | POA: Insufficient documentation

## 2021-12-01 HISTORY — DX: Dehydration: E86.0

## 2021-12-01 LAB — POCT URINALYSIS DIP (PROADVANTAGE DEVICE)
Bilirubin, UA: NEGATIVE
Blood, UA: NEGATIVE
Glucose, UA: NEGATIVE mg/dL
Ketones, POC UA: NEGATIVE mg/dL
Leukocytes, UA: NEGATIVE
Nitrite, UA: NEGATIVE
Protein Ur, POC: NEGATIVE mg/dL
Specific Gravity, Urine: 1.01
Urobilinogen, Ur: NEGATIVE
pH, UA: 6 (ref 5.0–8.0)

## 2021-12-01 NOTE — Patient Instructions (Addendum)
Please go to [facility] for your chest xray.   Their hours are 8am - 4:30 pm Monday - Friday.  Take your insurance card with you.   Imaging [phone]  [address], [address] [place], [place] [address]  [address] [place], [place] [address]     Bradycardia, Adult Bradycardia is a slower-than-normal heartbeat. A normal resting heart rate for an adult ranges from 60 to 100 beats per minute. With bradycardia, the resting heart rate is less than 60 beats per minute. Bradycardia can prevent enough oxygen from reaching certain areas of your body when you are active. It can be serious if it keeps enough oxygen from reaching your brain and other parts of your body. Bradycardia is not a problem for everyone. For some healthy adults, a slow resting heart rate is normal. What are the causes? This condition may be caused by: A problem with the heart, including: A problem with the heart's electrical system, such as a heart block. With a heart block, electrical signals between the chambers of the heart are partially or completely blocked, so they are not able to work as they should. A problem with the heart's natural pacemaker (sinus node). Heart disease. A heart attack. Heart damage. Lyme disease. A heart infection. A heart condition that is present at birth (congenital heart defect). Certain medicines that treat heart conditions. Certain conditions, such as hypothyroidism and obstructive sleep apnea. Problems with the balance of chemicals and other substances, like potassium, in the blood. Trauma. Radiation therapy. What increases the risk? You are more likely to develop this condition if you: Are age 73 or older. Have high blood pressure (hypertension), high cholesterol (hyperlipidemia), or diabetes. Drink heavily, use tobacco or nicotine products, or use drugs. What are the signs or symptoms? Symptoms of this condition include: Light-headedness. Feeling faint or  fainting. Fatigue and weakness. Trouble with activity or exercise. Shortness of breath. Chest pain (angina). Drowsiness. Confusion. Dizziness. How is this diagnosed? This condition may be diagnosed based on: Your symptoms. Your medical history. A physical exam. During the exam, your health care provider will listen to your heartbeat and check your pulse. To confirm the diagnosis, your health care provider may order tests, such as: Blood tests. An electrocardiogram (ECG). This test records the heart's electrical activity. The test can show how fast your heart is beating and whether the heartbeat is steady. A test in which you wear a portable device (event recorder or Holter monitor) to record your heart's electrical activity while you go about your day. An exercise test. How is this treated? Treatment for this condition depends on the cause of the condition and how severe your symptoms are. Treatment may involve: Treatment of the underlying condition. Changing your medicines or how much medicine you take. Having a small, battery-operated device called a pacemaker implanted under the skin. When bradycardia occurs, this device can be used to increase your heart rate and help your heart beat in a regular rhythm. Follow these instructions at home: Lifestyle Manage any health conditions that contribute to bradycardia as told by your health care provider. Follow a heart-healthy diet. A nutrition specialist (dietitian) can help educate you about healthy food options and changes. Follow an exercise program that is approved by your health care provider. Maintain a healthy weight. Try to reduce or manage your stress, such as with yoga or meditation. If you need help reducing stress, ask your health care provider. Do not use any products that contain nicotine or  tobacco. These products include cigarettes, chewing tobacco, and vaping devices, such as e-cigarettes. If you need help quitting, ask your  health care provider. Do not use illegal drugs. Alcohol use If you drink alcohol: Limit how much you have to: 0-1 drink a day for women who are not pregnant. 0-2 drinks a day for men. Know how much alcohol is in a drink. In the [place] one drink equals one 12 oz bottle of beer (355 mL), one 5 oz glass of wine (148 mL), or one 1 oz glass of hard liquor (44 mL). General instructions Take over-the-counter and prescription medicines only as told by your health care provider. Keep all follow-up visits. This is important. How is this prevented? In some cases, bradycardia may be prevented by: Treating underlying medical problems. Stopping behaviors or medicines that can trigger the condition. Contact a health care provider if: You feel light-headed or dizzy. You almost faint. You feel weak or are easily fatigued during physical activity. You experience confusion or have memory problems. Get help right away if: You faint. You have chest pains or an irregular heartbeat (palpitations). You have trouble breathing. These symptoms may represent a serious problem that is an emergency. Do not wait to see if the symptoms will go away. Get medical help right away. Call your local emergency services (911 in the [place] Do not drive yourself to the hospital. Summary Bradycardia is a slower-than-normal heartbeat. With bradycardia, the resting heart rate is less than 60 beats per minute. Treatment for this condition depends on the cause. Manage any health conditions that contribute to bradycardia as told by your health care provider. Do not use any products that contain nicotine or tobacco. These products include cigarettes, chewing tobacco, and vaping devices, such as e-cigarettes. Keep all follow-up visits. This is important. This information is not intended to replace advice given to you by your health care provider. Make sure you discuss any questions you have with your health care provider. Document  Revised: 07/13/2020 Document Reviewed: 07/13/2020 Elsevier Patient Education  2023 [employer]    Dehydration, Adult Dehydration is condition in which there is not enough water or other fluids in the body. This happens when a person loses more fluids than he or she takes in. Important body parts cannot work right without the right amount of fluids. Any loss of fluids from the body can cause dehydration. Dehydration can be mild, worse, or very bad. It should be treated right away to keep it from getting very bad. What are the causes? This condition may be caused by: Conditions that cause loss of water or other fluids, such as: Watery poop (diarrhea). Vomiting. Sweating a lot. Peeing (urinating) a lot. Not drinking enough fluids, especially when you: Are ill. Are doing things that take a lot of energy to do. Other illnesses and conditions, such as fever or infection. Certain medicines, such as medicines that take extra fluid out of the body (diuretics). Lack of safe drinking water. Not being able to get enough water and food. What increases the risk? The following factors may make you more likely to develop this condition: Having a long-term (chronic) illness that has not been treated the right way, such as: Diabetes. Heart disease. Kidney disease. Being 78 years of age or older. Having a disability. Living in a place that is high above the ground or sea (high in altitude). The thinner, dried air causes more fluid loss. Doing exercises that put stress on your body for a long time.  What are the signs or symptoms? Symptoms of dehydration depend on how bad it is. Mild or worse dehydration Thirst. Dry lips or dry mouth. Feeling dizzy or light-headed, especially when you stand up from sitting. Muscle cramps. Your body making: Dark pee (urine). Pee may be the color of tea. Less pee than normal. Less tears than normal. Headache. Very bad dehydration Changes in skin. Skin  may: Be cold to the touch (clammy). Be blotchy or pale. Not go back to normal right after you lightly pinch it and let it go. Little or no tears, pee, or sweat. Changes in vital signs, such as: Fast breathing. Low blood pressure. Weak pulse. Pulse that is more than 100 beats a minute when you are sitting still. Other changes, such as: Feeling very thirsty. Eyes that look hollow (sunken). Cold hands and feet. Being mixed up (confused). Being very tired (lethargic) or having trouble waking from sleep. Short-term weight loss. Loss of consciousness. How is this treated? Treatment for this condition depends on how bad it is. Treatment should start right away. Do not wait until your condition gets very bad. Very bad dehydration is an emergency. You will need to go to a hospital. Mild or worse dehydration can be treated at home. You may be asked to: Drink more fluids. Drink an oral rehydration solution (ORS). This drink helps get the right amounts of fluids and salts and minerals in the blood (electrolytes). Very bad dehydration can be treated: With fluids through an IV tube. By getting normal levels of salts and minerals in your blood. This is often done by giving salts and minerals through a tube. The tube is passed through your nose and into your stomach. By treating the root cause. Follow these instructions at home: Oral rehydration solution If told by your doctor, drink an ORS: Make an ORS. Use instructions on the package. Start by drinking small amounts, about  cup (120 mL) every 5-10 minutes. Slowly drink more until you have had the amount that your doctor said to have. Eating and drinking        Drink enough clear fluid to keep your pee pale yellow. If you were told to drink an ORS, finish the ORS first. Then, start slowly drinking other clear fluids. Drink fluids such as: Water. Do not drink only water. Doing that can make the salt (sodium) level in your body get too  low. Water from ice chips you suck on. Fruit juice that you have added water to (diluted). Low-calorie sports drinks. Eat foods that have the right amounts of salts and minerals, such as: Bananas. Oranges. Potatoes. Tomatoes. Spinach. Do not drink alcohol. Avoid: Drinks that have a lot of sugar. These include: High-calorie sports drinks. Fruit juice that you did not add water to. Soda. Caffeine. Foods that are greasy or have a lot of fat or sugar. General instructions Take over-the-counter and prescription medicines only as told by your doctor. Do not take salt tablets. Doing that can make the salt level in your body get too high. Return to your normal activities as told by your doctor. Ask your doctor what activities are safe for you. Keep all follow-up visits as told by your doctor. This is important. Contact a doctor if: You have pain in your belly (abdomen) and the pain: Gets worse. Stays in one place. You have a rash. You have a stiff neck. You get angry or annoyed (irritable) more easily than normal. You are more tired or have a harder time  waking than normal. You feel: Weak or dizzy. Very thirsty. Get help right away if you have: Any symptoms of very bad dehydration. Symptoms of vomiting, such as: You cannot eat or drink without vomiting. Your vomiting gets worse or does not go away. Your vomit has blood or green stuff in it. Symptoms that get worse with treatment. A fever. A very bad headache. Problems with peeing or pooping (having a bowel movement), such as: Watery poop that gets worse or does not go away. Blood in your poop (stool). This may cause poop to look black and tarry. Not peeing in 6-8 hours. Peeing only a small amount of very dark pee in 6-8 hours. Trouble breathing. These symptoms may be an emergency. Do not wait to see if the symptoms will go away. Get medical help right away. Call your local emergency services (911 in the [place] Do not drive  yourself to the hospital. Summary Dehydration is a condition in which there is not enough water or other fluids in the body. This happens when a person loses more fluids than he or she takes in. Treatment for this condition depends on how bad it is. Treatment should be started right away. Do not wait until your condition gets very bad. Drink enough clear fluid to keep your pee pale yellow. If you were told to drink an oral rehydration solution (ORS), finish the ORS first. Then, start slowly drinking other clear fluids. Take over-the-counter and prescription medicines only as told by your doctor. Get help right away if you have any symptoms of very bad dehydration. This information is not intended to replace advice given to you by your health care provider. Make sure you discuss any questions you have with your health care provider. Document Revised: 07/29/2021 Document Reviewed: 11/02/2018 Elsevier Patient Education  2023 [employer].

## 2021-12-01 NOTE — Progress Notes (Signed)
Subjective:  Theresa Harrison is a 71 y.o. female who presents for Chief Complaint  Patient presents with   Follow-up    Follow up on skin.      Here for few different concerns  We did cryotherapy to a skin lesion on her low back about 4 weeks ago.  Here to recheck on this.  She says it never itched but it did blister.  It feels fine currently.  Here to recheck on the 2 other lesions we discussed on her upper back.  She notes that despite increasing water intake since we last talked she still feels dehydrated and that her skin is loose on her hands.  She is trying to drink about a liter and half of water daily.  She denies eating a lot of sugary and salty foods.  She denies excessive sweats or excessive urination.  Her urine does tend to look clear and not dark in all  She notes in recent weeks to months feeling she gets out of breath a little easier than she thinks she should even doing stuff around the house.  No wheezing.  Non-smoker lifetime.  She gets occasional chest pain that she describes as more of a muscle sensation like she turned around but no other chest pain.  She sees your doctor in 2 days from where we referred you to our unsuccessful attempt to remove earwax out of her ears  No other aggravating or relieving factors.    No other c/o.  Past Medical History:  Diagnosis Date   Hyperlipidemia    Hypothyroid    Current Outpatient Medications on File Prior to Visit  Medication Sig Dispense Refill   atorvastatin (LIPITOR) 20 MG tablet Take 1 tablet (20 mg total) by mouth daily. 90 tablet 3   cholecalciferol (VITAMIN D3) 25 MCG (1000 UNIT) tablet Take 1,000 Units by mouth daily.     levothyroxine (SYNTHROID) 75 MCG tablet Take 1 tablet (75 mcg total) by mouth daily. 90 tablet 3   acetaminophen (TYLENOL) 500 MG tablet Take by mouth. (Patient not taking: Reported on 12/01/2021)     hydrocortisone 2.5 % cream Apply topically 2 (two) times daily. (Patient not taking:  Reported on 06/30/2021) 30 g 1   No current facility-administered medications on file prior to visit.   The following portions of the patient's history were reviewed and updated as appropriate: allergies, current medications, past family history, past medical history, past social history, past surgical history and problem list.  ROS Otherwise as in subjective above    Objective: BP 138/78   Pulse 64   Ht 4' 11.5" (1.511 m)   Wt 114 lb (51.7 kg)   BMI 22.64 kg/m   Wt Readings from Last 3 Encounters:  12/01/21 114 lb (51.7 kg)  11/02/21 114 lb (51.7 kg)  10/12/21 113 lb 12.8 oz (51.6 kg)   General appearance: alert, no distress, well developed, well nourished HEENT: normocephalic, sclerae anicteric, conjunctiva pink and moist, TMs pearly, nares patent, no discharge or erythema, pharynx normal Oral cavity: somewhat dry MM, no lesions Neck: supple, no lymphadenopathy, no thyromegaly, no masses Heart: RRR, normal S1, S2, no murmurs Lungs: CTA bilaterally, no wheezes, rhonchi, or rales Pulses: 2+ radial pulses, 2+ pedal pulses, normal cap refill Ext: no edema Skin: Right lower back skin lesion as a darker crust with slight pinkish perimeter more flat than it was, appears to be healing appropriately  EKG reviewed showing marked sinus bradycardia, otherwise normal  Assessment: Encounter Diagnoses  Name Primary?   DOE (dyspnea on exertion) Yes   Skin lesion    Dehydration    Hypothyroidism, unspecified type    Bradycardia      Plan: Dyspnea on exertion - we discussed possible causes.  EKG today.  We will send for chest x-ray  Skin lesion-right lower back lesion healing appropriately.  We discussed the 2 upper back lesions that could be actinic keratoses.  She did not want to do cryotherapy on those 2 today  Concern for dehydration-she does look little dry in the mouth and there is some skin tenting.  She has actually increased her water intake since we talked about this over  the last few months.  Urinalysis today.  Limit salt, continue good hydration with water.  Hypothyroidism-compliant with medication.  Her last labs about a month ago were normal  Theresa Harrison was seen today for follow-up.  Diagnoses and all orders for this visit:  DOE (dyspnea on exertion) -     DG Chest 2 View; Future -     EKG 12-Lead -     Ambulatory referral to Cardiology  Skin lesion  Dehydration -     POCT Urinalysis DIP (Proadvantage Device) -     ADH -     Prolactin  Hypothyroidism, unspecified type -     ADH -     Prolactin  Bradycardia -     Ambulatory referral to Cardiology    Follow up: pending labs, chest xray, referral

## 2021-12-02 ENCOUNTER — Other Ambulatory Visit: Payer: Self-pay | Admitting: Medical

## 2021-12-02 DIAGNOSIS — R0609 Other forms of dyspnea: Secondary | ICD-10-CM

## 2021-12-02 DIAGNOSIS — R9389 Abnormal findings on diagnostic imaging of other specified body structures: Secondary | ICD-10-CM

## 2021-12-03 ENCOUNTER — Ambulatory Visit
Admission: RE | Admit: 2021-12-03 | Discharge: 2021-12-03 | Disposition: A | Discharge status: Home / Self Care | Payer: Medicare Other | Source: Ambulatory Visit | Attending: Medical | Admitting: Medical

## 2021-12-03 ENCOUNTER — Other Ambulatory Visit: Payer: Self-pay | Admitting: Medical

## 2021-12-03 DIAGNOSIS — R0609 Other forms of dyspnea: Secondary | ICD-10-CM

## 2021-12-03 DIAGNOSIS — R9389 Abnormal findings on diagnostic imaging of other specified body structures: Secondary | ICD-10-CM

## 2021-12-04 ENCOUNTER — Ambulatory Visit: Payer: Medicare Other | Attending: Internal Medicine | Admitting: Internal Medicine

## 2021-12-04 ENCOUNTER — Ambulatory Visit: Payer: Medicare Other | Attending: Internal Medicine

## 2021-12-04 ENCOUNTER — Encounter: Payer: Self-pay | Admitting: Internal Medicine

## 2021-12-04 ENCOUNTER — Other Ambulatory Visit: Payer: Self-pay | Admitting: Medical

## 2021-12-04 VITALS — BP 123/70 | HR 51 | Ht 59.5 in | Wt 114.4 lb

## 2021-12-04 DIAGNOSIS — R001 Bradycardia, unspecified: Secondary | ICD-10-CM

## 2021-12-04 DIAGNOSIS — R9431 Abnormal electrocardiogram [ECG] [EKG]: Secondary | ICD-10-CM | POA: Diagnosis not present

## 2021-12-04 DIAGNOSIS — R0602 Shortness of breath: Secondary | ICD-10-CM | POA: Insufficient documentation

## 2021-12-04 DIAGNOSIS — Z01812 Encounter for preprocedural laboratory examination: Secondary | ICD-10-CM | POA: Insufficient documentation

## 2021-12-04 DIAGNOSIS — R911 Solitary pulmonary nodule: Secondary | ICD-10-CM

## 2021-12-04 LAB — BASIC METABOLIC PANEL
BUN/Creatinine Ratio: 17 (ref 12–28)
BUN: 13 mg/dL (ref 8–27)
CO2: 27 mmol/L (ref 20–29)
Calcium: 10.4 mg/dL — ABNORMAL HIGH (ref 8.7–10.3)
Chloride: 103 mmol/L (ref 96–106)
Creatinine, Ser: 0.75 mg/dL (ref 0.57–1.00)
Glucose: 93 mg/dL (ref 70–99)
Potassium: 5 mmol/L (ref 3.5–5.2)
Sodium: 141 mmol/L (ref 134–144)
eGFR: 85 mL/min/{1.73_m2} (ref >59)

## 2021-12-04 NOTE — Progress Notes (Signed)
Cardiology Office Note:    Date:  12/04/2021   ID:  Theresa Harrison, DOB 06/16/1950, MRN [id number]  PCP:  Genia Del   [facility] Providers Cardiologist:  Maisie Fus, MD     Referring MD: Jac Canavan, PA-C   No chief complaint on file. DOE  History of Present Illness:    Theresa Harrison is a 71 y.o. female with a hx of HLD, hypothyroidism  referral for DOE, c/f bradycardia.  She notes she walked from her bedroom to the kitchen and she noted significant SOB. No chest pressure. She notes some lower heart rates. No syncope. She can get LH. Blood pressure is well controlled. No cardiac dx hx. She had a stress test many years ago. It was normal. Non smoker. Negative PND, orthopnea or LE edema.   Past Medical History:  Diagnosis Date   Hyperlipidemia    Hypothyroid     No past surgical history on file.  Current Medications: Current Meds  Medication Sig   acetaminophen (TYLENOL) 500 MG tablet Take by mouth.   atorvastatin (LIPITOR) 20 MG tablet Take 1 tablet (20 mg total) by mouth daily.   cholecalciferol (VITAMIN D3) 25 MCG (1000 UNIT) tablet Take 1,000 Units by mouth daily.   hydrocortisone 2.5 % cream Apply topically 2 (two) times daily.   levothyroxine (SYNTHROID) 75 MCG tablet Take 1 tablet (75 mcg total) by mouth daily.     Allergies:   Ancef [cefazolin] and Latex   Social History   Socioeconomic History   Marital status: Divorced    Spouse name: Not on file   Number of children: Not on file   Years of education: Not on file   Highest education level: Not on file  Occupational History   Not on file  Tobacco Use   Smoking status: Never   Smokeless tobacco: Never  Substance and Sexual Activity   Alcohol use: Not Currently   Drug use: Never   Sexual activity: Not on file  Other Topics Concern   Not on file  Social History Narrative   Lives alone.  Has 2 children, 2 adult sons.   Used to work in Designer, television/film set for medical office for 38 years.   Walks for exercise.  12/2020.     Social Determinants of Health   Financial Resource Strain: Not on file  Food Insecurity: Not on file  Transportation Needs: Not on file  Physical Activity: Not on file  Stress: Not on file  Social Connections: Not on file     Family History: The patient's family history includes Thyroid disease in her brother and mother. There is no history of Heart disease.  ROS:   Please see the history of present illness.     All other systems reviewed and are negative.  EKGs/Labs/Other Studies Reviewed:    The following studies were reviewed today:   EKG:  EKG is  ordered today.  The ekg ordered today demonstrates   12/04/2021-Sinus bradycardia HR 54bpm  Recent Labs: 06/30/2021: ALT 14 10/12/2021: BUN 13; Creatinine, Ser 0.66; Hemoglobin 13.9; Platelets 248; Potassium 4.4; Sodium 143; TSH 1.160  Recent Lipid Panel    Component Value Date/Time   CHOL 203 (H) 12/25/2020 1323   TRIG 142 12/25/2020 1323   HDL 82 12/25/2020 1323   CHOLHDL 2.5 12/25/2020 1323   LDLCALC 97 12/25/2020 1323     Risk Assessment/Calculations:     Physical Exam:    VS:  Vitals:   12/04/21 0948  BP: 123/70  Pulse: (!) 51  SpO2: 100%     Wt Readings from Last 3 Encounters:  12/04/21 114 lb 6.4 oz (51.9 kg)  12/01/21 114 lb (51.7 kg)  11/02/21 114 lb (51.7 kg)     GEN:  Well nourished, well developed in no acute distress HEENT: Normal NECK: No JVD; No carotid bruits LYMPHATICS: No lymphadenopathy CARDIAC: RRR, no murmurs, rubs, gallops RESPIRATORY:  Clear to auscultation without rales, wheezing or rhonchi  ABDOMEN: Soft, non-tender, non-distended MUSCULOSKELETAL:  No edema; No deformity  SKIN: Warm and dry NEUROLOGIC:  Alert and oriented x 3 PSYCHIATRIC:  Normal affect   ASSESSMENT:   Sinus bradycardia: EKG was benign.  She does not have high risk features including syncope c/f arrhythmia , family hx of SCD, or  abnormalities on her EKGs.  Will monitor with a ziopatch.  DOE: with acute onset SOB and CVD risk with age, will plan for coronary CTA and TTE. No signs of CHF. Don't hear a murmur  HLD: atorvastatin 20 mg daily PLAN:    In order of problems listed above:  Coronary CTA TTE One week ziopatch Follow up as needed, pending the results      Medication Adjustments/Labs and Tests Ordered: Current medicines are reviewed at length with the patient today.  Concerns regarding medicines are outlined above.  Orders Placed This Encounter  Procedures   CT CORONARY MORPH W/CTA COR W/SCORE W/CA W/CM &/OR WO/CM   Basic Metabolic Panel (BMET)   LONG TERM MONITOR (3-14 DAYS)   ECHOCARDIOGRAM COMPLETE   No orders of the defined types were placed in this encounter.   Patient Instructions  Medication Instructions:  Your physician recommends that you continue on your current medications as directed. Please refer to the Current Medication list given to you today.  *If you need a refill on your cardiac medications before your next appointment, please call your pharmacy*   Lab Work: TODAY: BMET If you have labs (blood work) drawn today and your tests are completely normal, you will receive your results only by: MyChart Message (if you have MyChart) OR A paper copy in the mail If you have any lab test that is abnormal or we need to change your treatment, we will call you to review the results.   Testing/Procedures: Your physician has requested that you have an echocardiogram. Echocardiography is a painless test that uses sound waves to create images of your heart. It provides your doctor with information about the size and shape of your heart and how well your heart's chambers and valves are working. This procedure takes approximately one hour. There are no restrictions for this procedure.  ZIO XT- Long Term Monitor Instructions  Your physician has requested you wear a ZIO patch monitor for 14  days.  This is a single patch monitor. [employer] supplies one patch monitor per enrollment. Additional stickers are not available. Please do not apply patch if you will be having a Nuclear Stress Test,  Echocardiogram, Cardiac CT, MRI, or Chest Xray during the period you would be wearing the  monitor. The patch cannot be worn during these tests. You cannot remove and re-apply the  ZIO XT patch monitor.  Your ZIO patch monitor will be mailed 3 day USPS to your address on file. It may take 3-5 days  to receive your monitor after you have been enrolled.  Once you have received your monitor, please review the enclosed instructions. Your monitor  has  already been registered assigning a specific monitor serial # to you.  Billing and Patient Assistance Program Information  We have supplied [employer] with any of your insurance information on file for billing purposes. [employer] offers a sliding scale Patient Assistance Program for patients that do not have  insurance, or whose insurance does not completely cover the cost of the ZIO monitor.  You must apply for the Patient Assistance Program to qualify for this discounted rate.  To apply, please call [employer] at [phone], select option 4, select option 2, ask to apply for  Patient Assistance Program. Meredeth Ide will ask your household income, and how many people  are in your household. They will quote your out-of-pocket cost based on that information.  [employer] will also be able to set up a 35-month, interest-free payment plan if needed.  Applying the monitor   Shave hair from upper left chest.  Hold abrader disc by orange tab. Rub abrader in 40 strokes over the upper left chest as  indicated in your monitor instructions.  Clean area with 4 enclosed alcohol pads. Let dry.  Apply patch as indicated in monitor instructions. Patch will be placed under collarbone on left  side of chest with arrow pointing upward.  Rub patch adhesive wings for 2 minutes.  Remove white label marked "1". Remove the white  label marked "2". Rub patch adhesive wings for 2 additional minutes.  While looking in a mirror, press and release button in center of patch. A small green light will  flash 3-4 times. This will be your only indicator that the monitor has been turned on.  Do not shower for the first 24 hours. You may shower after the first 24 hours.  Press the button if you feel a symptom. You will hear a small click. Record Date, Time and  Symptom in the Patient Logbook.  When you are ready to remove the patch, follow instructions on the last 2 pages of Patient  Logbook. Stick patch monitor onto the last page of Patient Logbook.  Place Patient Logbook in the blue and white box. Use locking tab on box and tape box closed  securely. The blue and white box has prepaid postage on it. Please place it in the mailbox as  soon as possible. Your physician should have your test results approximately 7 days after the  monitor has been mailed back to [facility].  Call [facility] Customer Care at [phone] if you have questions regarding  your ZIO XT patch monitor. Call them immediately if you see an orange light blinking on your  monitor.  If your monitor falls off in less than 4 days, contact our Monitor department at [phone].  If your monitor becomes loose or falls off after 4 days call [employer] at [phone] for  suggestions on securing your monitor.  .  Your cardiac CT will be scheduled at one of the below locations:   [facility] [address] [place], [place] [address] [phone]   If scheduled at [facility], please arrive at the [facility] and Children's Entrance (Entrance C2) of [facility] 30 minutes prior to test start time. You can use the FREE valet parking offered at entrance C (encouraged to control the heart rate for the test)  Proceed to the [facility] Radiology Department (first floor) to check-in  and test prep.  All radiology patients and guests should use entrance C2 at [facility], accessed from [facility], even though the hospital's  physical address listed is [address].    Please follow these instructions carefully (unless otherwise directed):  On the Night Before the Test: Be sure to Drink plenty of water. Do not consume any caffeinated/decaffeinated beverages or chocolate 12 hours prior to your test. Do not take any antihistamines 12 hours prior to your test.  On the Day of the Test: Drink plenty of water until 1 hour prior to the test. Do not eat any food 4 hours prior to the test. You may take your regular medications prior to the test.  FEMALES- please wear underwire-free bra if available, avoid dresses & tight clothing  After the Test: Drink plenty of water. After receiving IV contrast, you may experience a mild flushed feeling. This is normal. On occasion, you may experience a mild rash up to 24 hours after the test. This is not dangerous. If this occurs, you can take Benadryl 25 mg and increase your fluid intake. If you experience trouble breathing, this can be serious. If it is severe call 911 IMMEDIATELY. If it is mild, please call our office. If you take any of these medications: Glipizide/Metformin, Avandament, Glucavance, please do not take 48 hours after completing test unless otherwise instructed.  We will call to schedule your test 2-4 weeks out understanding that some insurance companies will need an authorization prior to the service being performed.   For non-scheduling related questions, please contact the cardiac imaging nurse navigator should you have any questions/concerns: Rockwell Alexandria, Cardiac Imaging Nurse Navigator Larey Brick, Cardiac Imaging Nurse Navigator [facility] Direct Office Dial: [phone]   For scheduling needs, including cancellations and rescheduling, please call  [place], [phone].    Follow-Up: At [facility], you and your health needs are our priority.  As part of our continuing mission to provide you with exceptional heart care, we have created designated Provider Care Teams.  These Care Teams include your primary Cardiologist (physician) and Advanced Practice Providers (APPs -  Physician Assistants and Nurse Practitioners) who all work together to provide you with the care you need, when you need it.  We recommend signing up for the patient portal called "MyChart".  Sign up information is provided on this After Visit Summary.  MyChart is used to connect with patients for Virtual Visits (Telemedicine).  Patients are able to view lab/test results, encounter notes, upcoming appointments, etc.  Non-urgent messages can be sent to your provider as well.   To learn more about what you can do with MyChart, go to [email or web address].    Your next appointment:   As needed  The format for your next appointment:   In Person  Provider:   Maisie Fus, MD     Other Instructions   Important Information About Sugar         Signed, Maisie Fus, MD  12/04/2021 10:36 AM    [facility]

## 2021-12-04 NOTE — Progress Notes (Unsigned)
Enrolled for [employer] to mail a ZIO XT long term holter monitor to the patients address on file.  

## 2021-12-04 NOTE — Patient Instructions (Addendum)
Medication Instructions:  Your physician recommends that you continue on your current medications as directed. Please refer to the Current Medication list given to you today.  *If you need a refill on your cardiac medications before your next appointment, please call your pharmacy*   Lab Work: TODAY: BMET If you have labs (blood work) drawn today and your tests are completely normal, you will receive your results only by: MyChart Message (if you have MyChart) OR A paper copy in the mail If you have any lab test that is abnormal or we need to change your treatment, we will call you to review the results.   Testing/Procedures: Your physician has requested that you have an echocardiogram. Echocardiography is a painless test that uses sound waves to create images of your heart. It provides your doctor with information about the size and shape of your heart and how well your heart's chambers and valves are working. This procedure takes approximately one hour. There are no restrictions for this procedure.  ZIO XT- Long Term Monitor Instructions  Your physician has requested you wear a ZIO patch monitor for 14 days.  This is a single patch monitor. [employer] supplies one patch monitor per enrollment. Additional stickers are not available. Please do not apply patch if you will be having a Nuclear Stress Test,  Echocardiogram, Cardiac CT, MRI, or Chest Xray during the period you would be wearing the  monitor. The patch cannot be worn during these tests. You cannot remove and re-apply the  ZIO XT patch monitor.  Your ZIO patch monitor will be mailed 3 day USPS to your address on file. It may take 3-5 days  to receive your monitor after you have been enrolled.  Once you have received your monitor, please review the enclosed instructions. Your monitor  has already been registered assigning a specific monitor serial # to you.  Billing and Patient Assistance Program Information  We have supplied  [employer] with any of your insurance information on file for billing purposes. [employer] offers a sliding scale Patient Assistance Program for patients that do not have  insurance, or whose insurance does not completely cover the cost of the ZIO monitor.  You must apply for the Patient Assistance Program to qualify for this discounted rate.  To apply, please call [employer] at [phone], select option 4, select option 2, ask to apply for  Patient Assistance Program. Meredeth Ide will ask your household income, and how many people  are in your household. They will quote your out-of-pocket cost based on that information.  [employer] will also be able to set up a 64-month, interest-free payment plan if needed.  Applying the monitor   Shave hair from upper left chest.  Hold abrader disc by orange tab. Rub abrader in 40 strokes over the upper left chest as  indicated in your monitor instructions.  Clean area with 4 enclosed alcohol pads. Let dry.  Apply patch as indicated in monitor instructions. Patch will be placed under collarbone on left  side of chest with arrow pointing upward.  Rub patch adhesive wings for 2 minutes. Remove white label marked "1". Remove the white  label marked "2". Rub patch adhesive wings for 2 additional minutes.  While looking in a mirror, press and release button in center of patch. A small green light will  flash 3-4 times. This will be your only indicator that the monitor has been turned on.  Do not shower for the first 24 hours. You may shower after the  first 24 hours.  Press the button if you feel a symptom. You will hear a small click. Record Date, Time and  Symptom in the Patient Logbook.  When you are ready to remove the patch, follow instructions on the last 2 pages of Patient  Logbook. Stick patch monitor onto the last page of Patient Logbook.  Place Patient Logbook in the blue and white box. Use locking tab on box and tape box closed  securely. The blue and white box  has prepaid postage on it. Please place it in the mailbox as  soon as possible. Your physician should have your test results approximately 7 days after the  monitor has been mailed back to [facility].  Call [facility] Customer Care at [phone] if you have questions regarding  your ZIO XT patch monitor. Call them immediately if you see an orange light blinking on your  monitor.  If your monitor falls off in less than 4 days, contact our Monitor department at [phone].  If your monitor becomes loose or falls off after 4 days call [employer] at [phone] for  suggestions on securing your monitor.  .  Your cardiac CT will be scheduled at one of the below locations:   [facility] [address] [place], [place] [address] [phone]   If scheduled at [facility], please arrive at the [facility] and Children's Entrance (Entrance C2) of [facility] 30 minutes prior to test start time. You can use the FREE valet parking offered at entrance C (encouraged to control the heart rate for the test)  Proceed to the [facility] Radiology Department (first floor) to check-in and test prep.  All radiology patients and guests should use entrance C2 at [facility], accessed from [facility], even though the hospital's physical address listed is [address]    Please follow these instructions carefully (unless otherwise directed):  On the Night Before the Test: Be sure to Drink plenty of water. Do not consume any caffeinated/decaffeinated beverages or chocolate 12 hours prior to your test. Do not take any antihistamines 12 hours prior to your test.  On the Day of the Test: Drink plenty of water until 1 hour prior to the test. Do not eat any food 4 hours prior to the test. You may take your regular medications prior to the test.  FEMALES- please wear underwire-free bra if available, avoid dresses & tight clothing  After  the Test: Drink plenty of water. After receiving IV contrast, you may experience a mild flushed feeling. This is normal. On occasion, you may experience a mild rash up to 24 hours after the test. This is not dangerous. If this occurs, you can take Benadryl 25 mg and increase your fluid intake. If you experience trouble breathing, this can be serious. If it is severe call 911 IMMEDIATELY. If it is mild, please call our office. If you take any of these medications: Glipizide/Metformin, Avandament, Glucavance, please do not take 48 hours after completing test unless otherwise instructed.  We will call to schedule your test 2-4 weeks out understanding that some insurance companies will need an authorization prior to the service being performed.   For non-scheduling related questions, please contact the cardiac imaging nurse navigator should you have any questions/concerns: Rockwell Alexandria, Cardiac Imaging Nurse Navigator Larey Brick, Cardiac Imaging Nurse Navigator [facility] Direct Office Dial: [phone]   For scheduling needs, including cancellations and rescheduling, please call [place],  [phone].    Follow-Up: At [facility], you and your health needs are our priority.  As part of our continuing mission to provide you with exceptional heart care, we have created designated Provider Care Teams.  These Care Teams include your primary Cardiologist (physician) and Advanced Practice Providers (APPs -  Physician Assistants and Nurse Practitioners) who all work together to provide you with the care you need, when you need it.  We recommend signing up for the patient portal called "MyChart".  Sign up information is provided on this After Visit Summary.  MyChart is used to connect with patients for Virtual Visits (Telemedicine).  Patients are able to view lab/test results, encounter notes, upcoming appointments, etc.  Non-urgent messages can be sent to your  provider as well.   To learn more about what you can do with MyChart, go to [email or web address].    Your next appointment:   As needed  The format for your next appointment:   In Person  Provider:   Maisie Fus, MD     Other Instructions   Important Information About Sugar

## 2021-12-08 ENCOUNTER — Telehealth (HOSPITAL_COMMUNITY): Payer: Self-pay | Admitting: Emergency Medicine

## 2021-12-08 NOTE — Telephone Encounter (Signed)
Reaching out to patient to offer assistance regarding upcoming cardiac imaging study; pt verbalizes understanding of appt date/time, parking situation and where to check in, pre-test NPO status and medications ordered, and verified current allergies; name and call back number provided for further questions should they arise Rockwell Alexandria RN Navigator Cardiac Imaging [facility] [phone] office [phone] cell  Arrival 930 w/c entrance Daily meds Denies iv issues

## 2021-12-08 NOTE — Addendum Note (Signed)
Addended by: Brunetta Genera on: 12/08/2021 10:56 AM   Modules accepted: Orders

## 2021-12-09 ENCOUNTER — Ambulatory Visit (HOSPITAL_COMMUNITY)
Admission: RE | Admit: 2021-12-09 | Discharge: 2021-12-09 | Disposition: A | Discharge status: Home / Self Care | Payer: Medicare Other | Source: Ambulatory Visit | Attending: Internal Medicine | Admitting: Internal Medicine

## 2021-12-09 DIAGNOSIS — I700 Atherosclerosis of aorta: Secondary | ICD-10-CM

## 2021-12-09 DIAGNOSIS — R9431 Abnormal electrocardiogram [ECG] [EKG]: Secondary | ICD-10-CM | POA: Diagnosis present

## 2021-12-09 DIAGNOSIS — R001 Bradycardia, unspecified: Secondary | ICD-10-CM

## 2021-12-09 DIAGNOSIS — R0602 Shortness of breath: Secondary | ICD-10-CM | POA: Insufficient documentation

## 2021-12-09 MED ORDER — IOHEXOL 350 MG/ML SOLN
100.0000 mL | Freq: Once | INTRAVENOUS | Status: AC | PRN
  Administered 2021-12-09: 100 mL via INTRAVENOUS

## 2021-12-09 MED ORDER — NITROGLYCERIN 0.4 MG SL SUBL
SUBLINGUAL_TABLET | SUBLINGUAL | Status: AC
  Filled 2021-12-09: qty 2

## 2021-12-09 MED ORDER — NITROGLYCERIN 0.4 MG SL SUBL
0.8000 mg | SUBLINGUAL_TABLET | Freq: Once | SUBLINGUAL | Status: AC
  Administered 2021-12-09: 0.8 mg via SUBLINGUAL

## 2021-12-10 ENCOUNTER — Encounter: Payer: Self-pay | Admitting: Medical

## 2021-12-10 LAB — ADH: ADH: <0.8 pg/mL (ref 0.0–4.7)

## 2021-12-10 LAB — PROLACTIN: Prolactin: 8.8 ng/mL (ref 4.8–23.3)

## 2021-12-15 ENCOUNTER — Telehealth: Payer: Self-pay | Admitting: Internal Medicine

## 2021-12-15 NOTE — Telephone Encounter (Signed)
Pt is requesting call back in regards to labs and results of test.

## 2021-12-15 NOTE — Telephone Encounter (Signed)
Spoke with pt, aware of CTA results. CTA results and recent labs faxed to PCP at patients request. She reports dr Aleen Campi is following the lung nodule. Copy of labs and CTA mailed to patients confirmed address at her request.

## 2021-12-18 ENCOUNTER — Other Ambulatory Visit: Payer: Medicare Other

## 2021-12-21 ENCOUNTER — Ambulatory Visit (HOSPITAL_COMMUNITY): Payer: Medicare Other | Attending: Internal Medicine

## 2021-12-21 DIAGNOSIS — R0602 Shortness of breath: Secondary | ICD-10-CM | POA: Diagnosis present

## 2021-12-21 LAB — ECHOCARDIOGRAM COMPLETE
Area-P 1/2: 3.13 cm2
MV M vel: 5.85 m/s
MV Peak grad: 136.9 mmHg
Radius: 0.4 cm
S' Lateral: 3.2 cm

## 2022-01-06 ENCOUNTER — Other Ambulatory Visit (HOSPITAL_COMMUNITY): Payer: Medicare Other

## 2022-01-08 ENCOUNTER — Ambulatory Visit (INDEPENDENT_AMBULATORY_CARE_PROVIDER_SITE_OTHER): Payer: Medicare Other

## 2022-01-08 VITALS — BP 128/78 | HR 57 | Temp 97.5°F | Ht 59.5 in | Wt 114.0 lb

## 2022-01-08 DIAGNOSIS — Z1231 Encounter for screening mammogram for malignant neoplasm of breast: Secondary | ICD-10-CM | POA: Diagnosis not present

## 2022-01-08 DIAGNOSIS — Z0000 Encounter for general adult medical examination without abnormal findings: Secondary | ICD-10-CM

## 2022-01-08 NOTE — Patient Instructions (Signed)
Theresa Harrison , Thank you for taking time to come for your Medicare Wellness Visit. I appreciate your ongoing commitment to your health goals. Please review the following plan we discussed and let me know if I can assist you in the future.   Screening recommendations/referrals:a Colonoscopy: completed 11/06/2019 Mammogram: ordered today Bone Density: due Recommended yearly ophthalmology/optometry visit for glaucoma screening and checkup Recommended yearly dental visit for hygiene and checkup  Vaccinations: Influenza vaccine: n/a Pneumococcal vaccine: completed 02/12/2021 Tdap vaccine:  Shingles vaccine: discussed   Covid-19: 01/28/2021, 03/18/2020, 06/05/2019, 05/08/2019  Advanced directives: Please bring a copy of your POA (Power of Attorney) and/or Living Will to your next appointment.   Conditions/risks identified: none  Next appointment: Follow up in one year for your annual wellness visit    Preventive Care 65 Years and Older, Female Preventive care refers to lifestyle choices and visits with your health care provider that can promote health and wellness. What does preventive care include? A yearly physical exam. This is also called an annual well check. Dental exams once or twice a year. Routine eye exams. Ask your health care provider how often you should have your eyes checked. Personal lifestyle choices, including: Daily care of your teeth and gums. Regular physical activity. Eating a healthy diet. Avoiding tobacco and drug use. Limiting alcohol use. Practicing safe sex. Taking low-dose aspirin every day. Taking vitamin and mineral supplements as recommended by your health care provider. What happens during an annual well check? The services and screenings done by your health care provider during your annual well check will depend on your age, overall health, lifestyle risk factors, and family history of disease. Counseling  Your health care provider may ask you questions  about your: Alcohol use. Tobacco use. Drug use. Emotional well-being. Home and relationship well-being. Sexual activity. Eating habits. History of falls. Memory and ability to understand (cognition). Work and work Statistician. Reproductive health. Screening  You may have the following tests or measurements: Height, weight, and BMI. Blood pressure. Lipid and cholesterol levels. These may be checked every 5 years, or more frequently if you are over 36 years old. Skin check. Lung cancer screening. You may have this screening every year starting at age 57 if you have a 30-pack-year history of smoking and currently smoke or have quit within the past 15 years. Fecal occult blood test (FOBT) of the stool. You may have this test every year starting at age 57. Flexible sigmoidoscopy or colonoscopy. You may have a sigmoidoscopy every 5 years or a colonoscopy every 10 years starting at age 63. Hepatitis C blood test. Hepatitis B blood test. Sexually transmitted disease (STD) testing. Diabetes screening. This is done by checking your blood sugar (glucose) after you have not eaten for a while (fasting). You may have this done every 1-3 years. Bone density scan. This is done to screen for osteoporosis. You may have this done starting at age 38. Mammogram. This may be done every 1-2 years. Talk to your health care provider about how often you should have regular mammograms. Talk with your health care provider about your test results, treatment options, and if necessary, the need for more tests. Vaccines  Your health care provider may recommend certain vaccines, such as: Influenza vaccine. This is recommended every year. Tetanus, diphtheria, and acellular pertussis (Tdap, Td) vaccine. You may need a Td booster every 10 years. Zoster vaccine. You may need this after age 36. Pneumococcal 13-valent conjugate (PCV13) vaccine. One dose is recommended after age  65. Pneumococcal polysaccharide (PPSV23)  vaccine. One dose is recommended after age 25. Talk to your health care provider about which screenings and vaccines you need and how often you need them. This information is not intended to replace advice given to you by your health care provider. Make sure you discuss any questions you have with your health care provider. Document Released: 04/18/2015 Document Revised: 12/10/2015 Document Reviewed: 01/21/2015 Elsevier Interactive Patient Education  2017 [place] Prevention in the Home Falls can cause injuries. They can happen to people of all ages. There are many things you can do to make your home safe and to help prevent falls. What can I do on the outside of my home? Regularly fix the edges of walkways and driveways and fix any cracks. Remove anything that might make you trip as you walk through a door, such as a raised step or threshold. Trim any bushes or trees on the path to your home. Use bright outdoor lighting. Clear any walking paths of anything that might make someone trip, such as rocks or tools. Regularly check to see if handrails are loose or broken. Make sure that both sides of any steps have handrails. Any raised decks and porches should have guardrails on the edges. Have any leaves, snow, or ice cleared regularly. Use sand or salt on walking paths during winter. Clean up any spills in your garage right away. This includes oil or grease spills. What can I do in the bathroom? Use night lights. Install grab bars by the toilet and in the tub and shower. Do not use towel bars as grab bars. Use non-skid mats or decals in the tub or shower. If you need to sit down in the shower, use a plastic, non-slip stool. Keep the floor dry. Clean up any water that spills on the floor as soon as it happens. Remove soap buildup in the tub or shower regularly. Attach bath mats securely with double-sided non-slip rug tape. Do not have throw rugs and other things on the floor that  can make you trip. What can I do in the bedroom? Use night lights. Make sure that you have a light by your bed that is easy to reach. Do not use any sheets or blankets that are too big for your bed. They should not hang down onto the floor. Have a firm chair that has side arms. You can use this for support while you get dressed. Do not have throw rugs and other things on the floor that can make you trip. What can I do in the kitchen? Clean up any spills right away. Avoid walking on wet floors. Keep items that you use a lot in easy-to-reach places. If you need to reach something above you, use a strong step stool that has a grab bar. Keep electrical cords out of the way. Do not use floor polish or wax that makes floors slippery. If you must use wax, use non-skid floor wax. Do not have throw rugs and other things on the floor that can make you trip. What can I do with my stairs? Do not leave any items on the stairs. Make sure that there are handrails on both sides of the stairs and use them. Fix handrails that are broken or loose. Make sure that handrails are as long as the stairways. Check any carpeting to make sure that it is firmly attached to the stairs. Fix any carpet that is loose or worn. Avoid having throw rugs at  the top or bottom of the stairs. If you do have throw rugs, attach them to the floor with carpet tape. Make sure that you have a light switch at the top of the stairs and the bottom of the stairs. If you do not have them, ask someone to add them for you. What else can I do to help prevent falls? Wear shoes that: Do not have high heels. Have rubber bottoms. Are comfortable and fit you well. Are closed at the toe. Do not wear sandals. If you use a stepladder: Make sure that it is fully opened. Do not climb a closed stepladder. Make sure that both sides of the stepladder are locked into place. Ask someone to hold it for you, if possible. Clearly mark and make sure that you  can see: Any grab bars or handrails. First and last steps. Where the edge of each step is. Use tools that help you move around (mobility aids) if they are needed. These include: Canes. Walkers. Scooters. Crutches. Turn on the lights when you go into a dark area. Replace any light bulbs as soon as they burn out. Set up your furniture so you have a clear path. Avoid moving your furniture around. If any of your floors are uneven, fix them. If there are any pets around you, be aware of where they are. Review your medicines with your doctor. Some medicines can make you feel dizzy. This can increase your chance of falling. Ask your doctor what other things that you can do to help prevent falls. This information is not intended to replace advice given to you by your health care provider. Make sure you discuss any questions you have with your health care provider. Document Released: 01/16/2009 Document Revised: 08/28/2015 Document Reviewed: 04/26/2014 Elsevier Interactive Patient Education  2017 [employer].

## 2022-01-08 NOTE — Progress Notes (Signed)
Subjective:   Tejal Clendenon is a 71 y.o. female who presents for Medicare Annual (Subsequent) preventive examination.  Review of Systems     Cardiac Risk Factors include: advanced age (>53men, >53 women);dyslipidemia     Objective:    Today's Vitals   01/08/22 1022  BP: 128/78  Pulse: (!) 57  Temp: (!) 97.5 F (36.4 C)  TempSrc: Oral  SpO2: 95%  Weight: 114 lb (51.7 kg)  Height: 4' 11.5" (1.511 m)   Body mass index is 22.64 kg/m.     01/08/2022   10:34 AM  Advanced Directives  Does Patient Have a Medical Advance Directive? Yes  Type of Estate agent of [place];Living will  Copy of Healthcare Power of Attorney in Chart? No - copy requested    Current Medications (verified) Outpatient Encounter Medications as of 01/08/2022  Medication Sig   acetaminophen (TYLENOL) 500 MG tablet Take by mouth.   atorvastatin (LIPITOR) 20 MG tablet Take 1 tablet (20 mg total) by mouth daily.   cholecalciferol (VITAMIN D3) 25 MCG (1000 UNIT) tablet Take 1,000 Units by mouth daily.   hydrocortisone 2.5 % cream Apply topically 2 (two) times daily.   levothyroxine (SYNTHROID) 75 MCG tablet Take 1 tablet (75 mcg total) by mouth daily.   No facility-administered encounter medications on file as of 01/08/2022.    Allergies (verified) Ancef [cefazolin] and Latex   History: Past Medical History:  Diagnosis Date   Hyperlipidemia    Hypothyroid    History reviewed. No pertinent surgical history. Family History  Problem Relation Age of Onset   Thyroid disease Mother    Thyroid disease Brother    Heart disease Neg Hx    Social History   Socioeconomic History   Marital status: Divorced    Spouse name: Not on file   Number of children: Not on file   Years of education: Not on file   Highest education level: Not on file  Occupational History   Not on file  Tobacco Use   Smoking status: Never   Smokeless tobacco: Never  Vaping Use   Vaping Use:  Never used  Substance and Sexual Activity   Alcohol use: Not Currently   Drug use: Never   Sexual activity: Not on file  Other Topics Concern   Not on file  Social History Narrative   Lives alone.  Has 2 children, 2 adult sons.   Used to work in Psychologist, clinical for medical office for 38 years.   Walks for exercise.  12/2020.     Social Determinants of Health   Financial Resource Strain: Low Risk  (01/08/2022)   Overall Financial Resource Strain (CARDIA)    Difficulty of Paying Living Expenses: Not hard at all  Food Insecurity: No Food Insecurity (01/08/2022)   Hunger Vital Sign    Worried About Running Out of Food in the Last Year: Never true    Ran Out of Food in the Last Year: Never true  Transportation Needs: No Transportation Needs (01/08/2022)   PRAPARE - Administrator, Civil Service (Medical): No    Lack of Transportation (Non-Medical): No  Physical Activity: Inactive (01/08/2022)   Exercise Vital Sign    Days of Exercise per Week: 0 days    Minutes of Exercise per Session: 0 min  Stress: No Stress Concern Present (01/08/2022)   [employer] of Occupational Health - Occupational Stress Questionnaire    Feeling of Stress : Not at all  Social Connections: Not on file    Tobacco Counseling Counseling given: Not Answered   Clinical Intake:  Pre-visit preparation completed: Yes  Pain : No/denies pain     Nutritional Status: BMI of 19-24  Normal Nutritional Risks: None Diabetes: No  How often do you need to have someone help you when you read instructions, pamphlets, or other written materials from your doctor or pharmacy?: 1 - Never What is the last grade level you completed in school?: college  Diabetic? no  Interpreter Needed?: No  Information entered by :: NAllen LPN   Activities of Daily Living    01/08/2022   10:42 AM  In your present state of health, do you have any difficulty performing the following activities:  Hearing? 0   Vision? 1  Comment eyes get tired and have dry eyes  Difficulty concentrating or making decisions? 0  Walking or climbing stairs? 0  Dressing or bathing? 0  Doing errands, shopping? 0  Preparing Food and eating ? N  Using the Toilet? N  In the past six months, have you accidently leaked urine? N  Do you have problems with loss of bowel control? N  Managing your Medications? N  Managing your Finances? N  Housekeeping or managing your Housekeeping? N    Patient Care Team: Tysinger, Kermit Balo, PA-C as PCP - General (Family Medicine) Maisie Fus, MD as PCP - Cardiology (Cardiology)  Indicate any recent Medical Services you may have received from other than [facility] providers in the past year (date may be approximate).     Assessment:   This is a routine wellness examination for Elianys.  Hearing/Vision screen Vision Screening - Comments:: Regular eye exams, Dr. Renaldo Fiddler  Dietary issues and exercise activities discussed: Current Exercise Habits: The patient does not participate in regular exercise at present   Goals Addressed             This Visit's Progress    Patient Stated       01/08/2022, wants to weigh 110 pounds       Depression Screen    01/08/2022   10:35 AM 06/30/2021   10:05 AM 12/25/2020   11:46 AM 09/02/2020   12:39 PM  PHQ 2/9 Scores  PHQ - 2 Score 0 1 0 0  PHQ- 9 Score 4       Fall Risk    01/08/2022   10:35 AM 12/01/2021    8:44 AM 06/30/2021   10:04 AM 12/25/2020   11:46 AM 09/02/2020   12:39 PM  Fall Risk   Falls in the past year? 0 1 1 1  0  Number falls in past yr: 0 0 0 0 0  Comment  last balance and slipped and fell in the shower-November 20, 2021     Injury with Fall? 0 0 1 1 0  Comment  bruise on her back     Risk for fall due to : Medication side effect History of fall(s) Impaired balance/gait History of fall(s) No Fall Risks  Follow up Falls prevention discussed;Falls evaluation completed;Education provided Falls evaluation completed Falls  evaluation completed Falls evaluation completed Falls evaluation completed    FALL RISK PREVENTION PERTAINING TO THE HOME:  Any stairs in or around the home? Yes  If so, are there any without handrails? No  Home free of loose throw rugs in walkways, pet beds, electrical cords, etc? Yes  Adequate lighting in your home to reduce risk of falls? Yes   ASSISTIVE DEVICES UTILIZED  TO PREVENT FALLS:  Life alert? No  Use of a cane, walker or w/c? No  Grab bars in the bathroom? No  Shower chair or bench in shower? No  Elevated toilet seat or a handicapped toilet? Yes   TIMED UP AND GO:  Was the test performed? Yes .  Length of time to ambulate 10 feet: 5 sec.   Gait steady and fast without use of assistive device  Cognitive Function:        01/08/2022   10:49 AM 12/25/2020   11:51 AM  6CIT Screen  What Year? 0 points 0 points  What month? 0 points 0 points  What time? 0 points 0 points  Count back from 20 0 points 0 points  Months in reverse 0 points 0 points  Repeat phrase 0 points 0 points  Total Score 0 points 0 points    Immunizations Immunization History  Administered Date(s) Administered   Influenza, High Dose Seasonal PF 01/19/2021   Moderna Covid-19 Vaccine Bivalent Booster 37yrs & up 01/28/2021   Moderna SARS-COV2 Booster Vaccination 03/18/2020   Moderna Sars-Covid-2 Vaccination 05/08/2019, 06/05/2019   PNEUMOCOCCAL CONJUGATE-20 02/12/2021    TDAP status: Due, Education has been provided regarding the importance of this vaccine. Advised may receive this vaccine at local pharmacy or Health Dept. Aware to provide a copy of the vaccination record if obtained from local pharmacy or Health Dept. Verbalized acceptance and understanding.  Flu Vaccine status: Declined, Education has been provided regarding the importance of this vaccine but patient still declined. Advised may receive this vaccine at local pharmacy or Health Dept. Aware to provide a copy of the vaccination  record if obtained from local pharmacy or Health Dept. Verbalized acceptance and understanding.  Pneumococcal vaccine status: Up to date  Covid-19 vaccine status: Completed vaccines  Qualifies for Shingles Vaccine? Yes   Zostavax completed No   Shingrix Completed?: No.    Education has been provided regarding the importance of this vaccine. Patient has been advised to call insurance company to determine out of pocket expense if they have not yet received this vaccine. Advised may also receive vaccine at local pharmacy or Health Dept. Verbalized acceptance and understanding.  Screening Tests Health Maintenance  Topic Date Due   Hepatitis C Screening  Never done   TETANUS/TDAP  Never done   Zoster Vaccines- Shingrix (1 of 2) Never done   DEXA SCAN  Never done   COVID-19 Vaccine (4 - Moderna series) 05/31/2021   MAMMOGRAM  09/27/2021   COLONOSCOPY (Pts 45-25yrs Insurance coverage will need to be confirmed)  11/05/2029   Pneumonia Vaccine 69+ Years old  Completed   HPV VACCINES  Aged Out   INFLUENZA VACCINE  Discontinued    Health Maintenance  Health Maintenance Due  Topic Date Due   Hepatitis C Screening  Never done   TETANUS/TDAP  Never done   Zoster Vaccines- Shingrix (1 of 2) Never done   DEXA SCAN  Never done   COVID-19 Vaccine (4 - Moderna series) 05/31/2021   MAMMOGRAM  09/27/2021    Colorectal cancer screening: No longer required.   Mammogram status: Ordered today. Pt provided with contact info and advised to call to schedule appt.   Bone Density status: due  Lung Cancer Screening: (Low Dose CT Chest recommended if Age 66-80 years, 30 pack-year currently smoking OR have quit w/in 15years.) does not qualify.   Lung Cancer Screening Referral: no  Additional Screening:  Hepatitis C Screening: does qualify;  Vision Screening: Recommended annual ophthalmology exams for early detection of glaucoma and other disorders of the eye. Is the patient up to date with their  annual eye exam?  Yes  Who is the provider or what is the name of the office in which the patient attends annual eye exams? Dr. Huntley Estelle If pt is not established with a provider, would they like to be referred to a provider to establish care? No .   Dental Screening: Recommended annual dental exams for proper oral hygiene  Community Resource Referral / Chronic Care Management: CRR required this visit?  No   CCM required this visit?  No      Plan:     I have personally reviewed and noted the following in the patient's chart:   Medical and social history Use of alcohol, tobacco or illicit drugs  Current medications and supplements including opioid prescriptions. Patient is not currently taking opioid prescriptions. Functional ability and status Nutritional status Physical activity Advanced directives List of other physicians Hospitalizations, surgeries, and ER visits in previous 12 months Vitals Screenings to include cognitive, depression, and falls Referrals and appointments  In addition, I have reviewed and discussed with patient certain preventive protocols, quality metrics, and best practice recommendations. A written personalized care plan for preventive services as well as general preventive health recommendations were provided to patient.     Kellie Simmering, LPN   [id number]   Nurse Notes: none

## 2022-01-21 ENCOUNTER — Ambulatory Visit
Admission: RE | Admit: 2022-01-21 | Discharge: 2022-01-21 | Disposition: A | Discharge status: Home / Self Care | Payer: Medicare Other | Source: Ambulatory Visit | Attending: Medical | Admitting: Medical

## 2022-01-21 DIAGNOSIS — Z1231 Encounter for screening mammogram for malignant neoplasm of breast: Secondary | ICD-10-CM

## 2022-02-01 ENCOUNTER — Ambulatory Visit: Payer: Medicare Other | Admitting: Medical

## 2022-02-23 ENCOUNTER — Telehealth: Payer: Self-pay | Admitting: Medical

## 2022-02-23 NOTE — Telephone Encounter (Signed)
error 

## 2022-05-18 ENCOUNTER — Ambulatory Visit (INDEPENDENT_AMBULATORY_CARE_PROVIDER_SITE_OTHER): Payer: Medicare Other | Admitting: Medical

## 2022-05-18 VITALS — BP 120/80 | HR 48 | Wt 113.0 lb

## 2022-05-18 DIAGNOSIS — Z780 Asymptomatic menopausal state: Secondary | ICD-10-CM | POA: Diagnosis not present

## 2022-05-18 DIAGNOSIS — M79671 Pain in right foot: Secondary | ICD-10-CM | POA: Diagnosis not present

## 2022-05-18 DIAGNOSIS — E039 Hypothyroidism, unspecified: Secondary | ICD-10-CM | POA: Diagnosis not present

## 2022-05-18 DIAGNOSIS — M810 Age-related osteoporosis without current pathological fracture: Secondary | ICD-10-CM

## 2022-05-18 DIAGNOSIS — R911 Solitary pulmonary nodule: Secondary | ICD-10-CM | POA: Insufficient documentation

## 2022-05-18 DIAGNOSIS — M79672 Pain in left foot: Secondary | ICD-10-CM

## 2022-05-18 DIAGNOSIS — R001 Bradycardia, unspecified: Secondary | ICD-10-CM

## 2022-05-18 DIAGNOSIS — E785 Hyperlipidemia, unspecified: Secondary | ICD-10-CM | POA: Diagnosis not present

## 2022-05-18 DIAGNOSIS — Z0000 Encounter for general adult medical examination without abnormal findings: Secondary | ICD-10-CM

## 2022-05-18 DIAGNOSIS — K644 Residual hemorrhoidal skin tags: Secondary | ICD-10-CM

## 2022-05-18 DIAGNOSIS — I700 Atherosclerosis of aorta: Secondary | ICD-10-CM | POA: Insufficient documentation

## 2022-05-18 DIAGNOSIS — L84 Corns and callosities: Secondary | ICD-10-CM | POA: Insufficient documentation

## 2022-05-18 DIAGNOSIS — E559 Vitamin D deficiency, unspecified: Secondary | ICD-10-CM | POA: Diagnosis not present

## 2022-05-18 DIAGNOSIS — E2839 Other primary ovarian failure: Secondary | ICD-10-CM | POA: Diagnosis not present

## 2022-05-18 DIAGNOSIS — Z7189 Other specified counseling: Secondary | ICD-10-CM | POA: Diagnosis not present

## 2022-05-18 DIAGNOSIS — R202 Paresthesia of skin: Secondary | ICD-10-CM | POA: Diagnosis not present

## 2022-05-18 NOTE — Patient Instructions (Signed)
This visit was a preventative care visit, also known as wellness visit or routine physical.   Topics typically include healthy lifestyle, diet, exercise, preventative care, vaccinations, sick and well care, proper use of emergency dept and after hours care, as well as other concerns.     Recommendations: Continue to return yearly for your annual wellness and preventative care visits.  This gives [place] a chance to discuss healthy lifestyle, exercise, vaccinations, review your chart record, and perform screenings where appropriate.  I recommend you see your eye doctor yearly for routine vision care.  I recommend you see your dentist yearly for routine dental care including hygiene visits twice yearly.   Vaccination recommendations were reviewed Immunization History  Administered Date(s) Administered   Influenza, High Dose Seasonal PF 01/19/2021   Moderna Covid-19 Vaccine Bivalent Booster 60yr & up 01/28/2021   Moderna SARS-COV2 Booster Vaccination 03/18/2020   Moderna Sars-Covid-2 Vaccination 05/08/2019, 06/05/2019   PNEUMOCOCCAL CONJUGATE-20 02/12/2021    Please get your Tdap tetanus and Shingles vaccine at your pharmacy and make sure we get a copy of the vaccine data  We will try and get copy of recent flu and covid vaccines at pharmacy   Screening for cancer: Colon cancer screening: I reviewed your colonoscopy on file that is up to date from 2021  Breast cancer screening: You should perform a self breast exam monthly.   We reviewed recommendations for regular mammograms and breast cancer screening.  Cervical cancer screening: No longer need, > age 153  Skin cancer screening: Check your skin regularly for new changes, growing lesions, or other lesions of concern Come in for evaluation if you have skin lesions of concern.  Lung cancer screening: If you have a greater than 20 pack year history of tobacco use, then you may qualify for lung cancer screening with a chest CT scan.    Please call your insurance company to inquire about coverage for this test.  We currently don't have screenings for other cancers besides breast, cervical, colon, and lung cancers.  If you have a strong family history of cancer or have other cancer screening concerns, please let me know.    Bone health: Get at least 150 minutes of aerobic exercise weekly Get weight bearing exercise at least once weekly Bone density test:  A bone density test is an imaging test that uses a type of X-ray to measure the amount of calcium and other minerals in your bones. The test may be used to diagnose or screen you for a condition that causes weak or thin bones (osteoporosis), predict your risk for a broken bone (fracture), or determine how well your osteoporosis treatment is working. The bone density test is recommended for females 669and older, or females or males <[id number] certain risk factors such as thyroid disease, long term use of steroids such as for asthma or rheumatological issues, vitamin D deficiency, estrogen deficiency, family history of osteoporosis, self or family history of fragility fracture in first degree relative.  2022 Bone Density test was abnormal showing osteoporosis.  Please call to schedule your bone density test after 01/2023  [address] [facility] [id number] [place] [id number]   Heart health: Get at least 150 minutes of aerobic exercise weekly Limit alcohol It is important to maintain a healthy blood pressure and healthy cholesterol numbers  Heart disease screening: Screening for heart disease includes screening for blood pressure, fasting lipids, glucose/diabetes screening, BMI height  to weight ratio, reviewed of smoking status, physical activity, and diet.    Goals include blood pressure 120/80 or less, maintaining a healthy lipid/cholesterol profile, preventing diabetes or keeping diabetes numbers under good  control, not smoking or using tobacco products, exercising most days per week or at least 150 minutes per week of exercise, and eating healthy variety of fruits and vegetables, healthy oils, and avoiding unhealthy food choices like fried food, fast food, high sugar and high cholesterol foods.    Other tests may possibly include EKG test, CT coronary calcium score, echocardiogram, exercise treadmill stress test.   Echocardiogram from 2023 reviewed.   IMPRESSIONS   Echocardiogram 12/2021:  1. Left ventricular ejection fraction, by estimation, is 60 to 65%. The  left ventricle has normal function. The left ventricle has no regional  wall motion abnormalities. Left ventricular diastolic parameters were  normal. The average left ventricular  global longitudinal strain is -21.6 %. The global longitudinal strain is  normal.   2. Right ventricular systolic function is normal. The right ventricular  size is normal. There is normal pulmonary artery systolic pressure.   3. The mitral valve is abnormal. Mild to moderate mitral valve  regurgitation. No evidence of mitral stenosis.   4. The aortic valve is tricuspid. There is mild calcification of the  aortic valve. Aortic valve regurgitation is not visualized. Aortic valve  sclerosis is present, with no evidence of aortic valve stenosis.   5. The inferior vena cava is normal in size with greater than 50%  respiratory variability, suggesting right atrial pressure of 3 mmHg.     Medical care options: I recommend you continue to seek care here first for routine care.  We try really hard to have available appointments Monday through Friday daytime hours for sick visits, acute visits, and physicals.  Urgent care should be used for after hours and weekends for significant issues that cannot wait till the next day.  The emergency department should be used for significant potentially life-threatening emergencies.  The emergency department is expensive, can  often have long wait times for less significant concerns, so try to utilize primary care, urgent care, or telemedicine when possible to avoid unnecessary trips to the emergency department.  Virtual visits and telemedicine have been introduced since the pandemic started in 2020, and can be convenient ways to receive medical care.  We offer virtual appointments as well to assist you in a variety of options to seek medical care.   Advanced Directives: I recommend you consider completing a [place] and Living Will.   These documents respect your wishes and help alleviate burdens on your loved ones if you were to become terminally ill or be in a position to need those documents enforced.    Get [place] copy of your Advanced Directives    Separate significant issues discussed: Vitamin D deficiency - updated labs today, continue supplement  Aortic atherosclerosis and high cholesterol Labs today Continue atorvastatin 45m daily Eat a low cholesterol diet  Hypothyroidism - continue your thyroid medication daily on empty stomach.  Labs today  Foot pain - labs today, consider imaging, consider therapy or other evaluation and management  Pulmonary nodule - due repeat CT scan 12/2022  Corn of foot - consider OTC corn remover liquid or consider referral to podiatry  Elevated calcium on prior  labs - updated labs today  Osteoporosis - In the past has used Prolia for possible many years, reclaast at one point.  But  due to dental and jaw issues, she no longer wants to do bisphosphonate.  Consider other treatment options along with aerobic and weight bearing exercise

## 2022-05-18 NOTE — Progress Notes (Signed)
Subjective:   HPI  Theresa Harrison is a 72 y.o. female who presents for Chief Complaint  Patient presents with   fasting cpe    Fasting cpe, discuss results from last fall. Discuss nodule in left lung    Patient Care Team: Ruthia Person, Camelia Eng, PA-C as PCP - General (Family Medicine) Harl Bowie Royetta Crochet, MD as PCP - Cardiology (Cardiology) Sees dentist Sees eye doctor Dr. Leighton Ruff, general surgery Dr. Charlotte Crumb, orthopedics   Concerns: Been having some right arm pains, worse with cold air.  Aches and joints worse with cold weather.  Hemorrhoids have been doing better.  Uses steroid cream periodically  Constipation - not having large golf ball poop now, no bleeding.  Overall improved.  Stools are more regular, formed so hemorrhoids have calmed down.  Still getting some pains in feet, feels like she is standing on a rock, gets burning, worried about neuropathy  Osteoporosis - wants to avoid bisphosphonate's, prior issues with those medications.    Reviewed their medical, surgical, family, social, medication, and allergy history and updated chart as appropriate.  Past Medical History:  Diagnosis Date   Hyperlipidemia    Hypothyroid     Family History  Problem Relation Age of Onset   Thyroid disease Mother    Thyroid disease Brother    Heart disease Neg Hx      Current Outpatient Medications:    acetaminophen (TYLENOL) 500 MG tablet, Take by mouth., Disp: , Rfl:    atorvastatin (LIPITOR) 20 MG tablet, Take 1 tablet (20 mg total) by mouth daily., Disp: 90 tablet, Rfl: 3   cholecalciferol (VITAMIN D3) 25 MCG (1000 UNIT) tablet, Take 1,000 Units by mouth daily., Disp: , Rfl:    hydrocortisone 2.5 % cream, Apply topically 2 (two) times daily., Disp: 30 g, Rfl: 1   levothyroxine (SYNTHROID) 75 MCG tablet, Take 1 tablet (75 mcg total) by mouth daily., Disp: 90 tablet, Rfl: 3  Allergies  Allergen Reactions   Ancef [Cefazolin]     Severe, hypotension    Latex Rash    Review of Systems  Constitutional:  Negative for chills, fever, malaise/fatigue and weight loss.  HENT:  Negative for congestion, ear pain, hearing loss, sore throat and tinnitus.   Eyes:  Negative for blurred vision, pain and redness.  Respiratory:  Negative for cough, hemoptysis and shortness of breath.   Cardiovascular:  Negative for chest pain, palpitations, orthopnea, claudication and leg swelling.  Gastrointestinal:  Positive for constipation. Negative for abdominal pain, blood in stool, diarrhea, nausea and vomiting.  Genitourinary:  Negative for dysuria, flank pain, frequency, hematuria and urgency.  Musculoskeletal:  Positive for back pain and joint pain. Negative for falls and myalgias.  Skin:  Negative for itching and rash.  Neurological:  Negative for dizziness, tingling, speech change, weakness and headaches.  Endo/Heme/Allergies:  Negative for polydipsia. Does not bruise/bleed easily.  Psychiatric/Behavioral:  Negative for depression and memory loss. The patient is not nervous/anxious and does not have insomnia.          05/18/2022    9:44 AM 01/08/2022   10:35 AM 06/30/2021   10:05 AM 12/25/2020   11:46 AM 09/02/2020   12:39 PM  Depression screen PHQ 2/9  Decreased Interest 0 0 0 0 0  Down, Depressed, Hopeless 0 0 1 0 0  PHQ - 2 Score 0 0 1 0 0  Altered sleeping  3     Tired, decreased energy  1     Change  in appetite  0     Feeling bad or failure about yourself   0     Trouble concentrating  0     Moving slowly or fidgety/restless  0     Suicidal thoughts  0     PHQ-9 Score  4     Difficult doing work/chores  Somewhat difficult          Objective:  BP 120/80   Pulse (!) 48   Wt 113 lb (51.3 kg)   BMI 22.44 kg/m   General appearance: alert, no distress, WD/WN, Caucasian female Skin: unremarkable HEENT: normocephalic, conjunctiva/corneas normal, sclerae anicteric, PERRLA, EOMi, nares patent, no discharge or erythema, pharynx normal Oral  cavity: MMM, tongue normal, teeth normal Neck: supple, no lymphadenopathy, no thyromegaly, no masses, normal ROM, no bruits Chest: non tender, normal shape and expansion Heart: RRR, normal S1, S2, no murmurs Lungs: CTA bilaterally, no wheezes, rhonchi, or rales Abdomen: +bs, soft, non tender, non distended, no masses, no hepatomegaly, no splenomegaly, no bruits Back: obvious scoliosis, otherwise non tender, normal ROM Musculoskeletal: upper extremities non tender, no obvious deformity, normal ROM throughout, lower extremities non tender, no obvious deformity, normal ROM throughout Extremities: no edema, no cyanosis, no clubbing Pulses: 2+ symmetric, upper and lower extremities, normal cap refill Neurological: alert, oriented x 3, CN2-12 intact, strength normal upper extremities and lower extremities, sensation normal throughout, DTRs 2+ throughout, no cerebellar signs, gait normal Psychiatric: normal affect, behavior normal, pleasant  Breast/gyn/rectal - deferred /declined  Diabetic Foot Exam - Simple   Simple Foot Form Visual Inspection See comments: Yes Sensation Testing Intact to touch and monofilament testing bilaterally: Yes Pulse Check Posterior Tibialis and Dorsalis pulse intact bilaterally: Yes Comments Corn of foot on the right volar surface of the in the region, corn on the left lateral first, otherwise nontender foot with normal ROM      Assessment and Plan :   Encounter Diagnoses  Name Primary?   Encounter for health maintenance examination in adult Yes   Vitamin D deficiency    Postmenopausal    Hyperlipidemia, unspecified hyperlipidemia type    Estrogen deficiency    External hemorrhoid    Hypothyroidism, unspecified type    Osteoporosis without current pathological fracture, unspecified osteoporosis type    Advanced directives, counseling/discussion    Bradycardia    Serum calcium elevated    Foot pain, bilateral    Paresthesia    Pulmonary nodule    Corn  of foot    Aortic atherosclerosis ([place])     This visit was a preventative care visit, also known as wellness visit or routine physical.   Topics typically include healthy lifestyle, diet, exercise, preventative care, vaccinations, sick and well care, proper use of emergency dept and after hours care, as well as other concerns.     Recommendations: Continue to return yearly for your annual wellness and preventative care visits.  This gives [place] a chance to discuss healthy lifestyle, exercise, vaccinations, review your chart record, and perform screenings where appropriate.  I recommend you see your eye doctor yearly for routine vision care.  I recommend you see your dentist yearly for routine dental care including hygiene visits twice yearly.   Vaccination recommendations were reviewed Immunization History  Administered Date(s) Administered   Influenza, High Dose Seasonal PF 01/19/2021   Moderna Covid-19 Vaccine Bivalent Booster 25yr & up 01/28/2021   Moderna SARS-COV2 Booster Vaccination 03/18/2020   Moderna Sars-Covid-2 Vaccination 05/08/2019, 06/05/2019   PNEUMOCOCCAL CONJUGATE-20 02/12/2021  Please get your Tdap tetanus and Shingles vaccine at your pharmacy and make sure we get a copy of the vaccine data  We will try and get copy of recent flu and covid vaccines at pharmacy   Screening for cancer: Colon cancer screening: I reviewed your colonoscopy on file that is up to date from 2021  Breast cancer screening: You should perform a self breast exam monthly.   We reviewed recommendations for regular mammograms and breast cancer screening.  Cervical cancer screening: No longer need, > age 63   Skin cancer screening: Check your skin regularly for new changes, growing lesions, or other lesions of concern Come in for evaluation if you have skin lesions of concern.  Lung cancer screening: If you have a greater than 20 pack year history of tobacco use, then you may qualify  for lung cancer screening with a chest CT scan.   Please call your insurance company to inquire about coverage for this test.  We currently don't have screenings for other cancers besides breast, cervical, colon, and lung cancers.  If you have a strong family history of cancer or have other cancer screening concerns, please let me know.    Bone health: Get at least 150 minutes of aerobic exercise weekly Get weight bearing exercise at least once weekly Bone density test:  A bone density test is an imaging test that uses a type of X-ray to measure the amount of calcium and other minerals in your bones. The test may be used to diagnose or screen you for a condition that causes weak or thin bones (osteoporosis), predict your risk for a broken bone (fracture), or determine how well your osteoporosis treatment is working. The bone density test is recommended for females 22 and older, or females or males [id number] if certain risk factors such as thyroid disease, long term use of steroids such as for asthma or rheumatological issues, vitamin D deficiency, estrogen deficiency, family history of osteoporosis, self or family history of fragility fracture in first degree relative.  2022 Bone Density test was abnormal showing osteoporosis.  Please call to schedule your bone density test after 01/2023  [facility]  [id number], [place], [place] [address]    Heart health: Get at least 150 minutes of aerobic exercise weekly Limit alcohol It is important to maintain a healthy blood pressure and healthy cholesterol numbers  Heart disease screening: Screening for heart disease includes screening for blood pressure, fasting lipids, glucose/diabetes screening, BMI height to weight ratio, reviewed of smoking status, physical activity, and diet.    Goals include blood pressure 120/80 or less, maintaining a healthy lipid/cholesterol profile, preventing  diabetes or keeping diabetes numbers under good control, not smoking or using tobacco products, exercising most days per week or at least 150 minutes per week of exercise, and eating healthy variety of fruits and vegetables, healthy oils, and avoiding unhealthy food choices like fried food, fast food, high sugar and high cholesterol foods.    Other tests may possibly include EKG test, CT coronary calcium score, echocardiogram, exercise treadmill stress test.   Echocardiogram from 2023 reviewed.   IMPRESSIONS   Echocardiogram 12/2021:  1. Left ventricular ejection fraction, by estimation, is 60 to 65%. The  left ventricle has normal function. The left ventricle has no regional  wall motion abnormalities. Left ventricular diastolic parameters were  normal. The average left ventricular  global longitudinal strain is -21.6 %. The global longitudinal strain is  normal.   2. Right ventricular systolic function is normal. The right ventricular  size is normal. There is normal pulmonary artery systolic pressure.   3. The mitral valve is abnormal. Mild to moderate mitral valve  regurgitation. No evidence of mitral stenosis.   4. The aortic valve is tricuspid. There is mild calcification of the  aortic valve. Aortic valve regurgitation is not visualized. Aortic valve  sclerosis is present, with no evidence of aortic valve stenosis.   5. The inferior vena cava is normal in size with greater than 50%  respiratory variability, suggesting right atrial pressure of 3 mmHg.     Medical care options: I recommend you continue to seek care here first for routine care.  We try really hard to have available appointments Monday through Friday daytime hours for sick visits, acute visits, and physicals.  Urgent care should be used for after hours and weekends for significant issues that cannot wait till the next day.  The emergency department should be used for significant potentially life-threatening  emergencies.  The emergency department is expensive, can often have long wait times for less significant concerns, so try to utilize primary care, urgent care, or telemedicine when possible to avoid unnecessary trips to the emergency department.  Virtual visits and telemedicine have been introduced since the pandemic started in 2020, and can be convenient ways to receive medical care.  We offer virtual appointments as well to assist you in a variety of options to seek medical care.   Advanced Directives: I recommend you consider completing a [place] and Living Will.   These documents respect your wishes and help alleviate burdens on your loved ones if you were to become terminally ill or be in a position to need those documents enforced.    Get [place] copy of your Advanced Directives    Separate significant issues discussed: Vitamin D deficiency - updated labs today, continue supplement  Aortic atherosclerosis and high cholesterol Labs today Continue atorvastatin 34m daily Eat a low cholesterol diet  Hypothyroidism - continue your thyroid medication daily on empty stomach.  Labs today  Foot pain - labs today, consider imaging, consider therapy or other evaluation and management  Pulmonary nodule - due repeat CT scan 12/2022  Corn of foot - consider OTC corn remover liquid or consider referral to podiatry  Elevated calcium on prior  labs - updated labs today  Osteoporosis - In the past has used Prolia for possible many years, reclaast at one point.  But due to dental and jaw issues, she no longer wants to do bisphosphonate.  Consider other treatment options along with aerobic and weight bearing exercise   TCharmainewas seen today for fasting cpe.  Diagnoses and all orders for this visit:  Encounter for health maintenance examination in adult -     DG Bone Density; Future -     Comprehensive metabolic panel -     CBC with Differential/Platelet -     Lipid panel -      VITAMIN D 25 Hydroxy (Vit-D Deficiency, Fractures) -     TSH -     Hepatitis C antibody -     Parathyroid hormone, intact (no Ca) -     Vitamin B12  Vitamin D deficiency -     VITAMIN D 25 Hydroxy (Vit-D Deficiency, Fractures)  Postmenopausal  Hyperlipidemia, unspecified hyperlipidemia type -     Lipid panel  Estrogen deficiency  External hemorrhoid  Hypothyroidism, unspecified type -  TSH  Osteoporosis without current pathological fracture, unspecified osteoporosis type  Advanced directives, counseling/discussion  Bradycardia  Serum calcium elevated -     Parathyroid hormone, intact (no Ca)  Foot pain, bilateral  Paresthesia -     Vitamin B12  Pulmonary nodule -     CT CHEST NODULE FOLLOW UP LOW DOSE W/O; Future  Corn of foot  Aortic atherosclerosis ([place])    Follow-up pending labs, yearly for physical

## 2022-05-19 ENCOUNTER — Other Ambulatory Visit: Payer: Self-pay | Admitting: Medical

## 2022-05-19 ENCOUNTER — Encounter: Payer: Self-pay | Admitting: Internal Medicine

## 2022-05-19 DIAGNOSIS — Z59819 Housing instability, housed unspecified: Secondary | ICD-10-CM

## 2022-05-19 LAB — LIPID PANEL
Chol/HDL Ratio: 2.7 ratio (ref 0.0–4.4)
Cholesterol, Total: 195 mg/dL (ref 100–199)
HDL: 71 mg/dL (ref >39)
LDL Chol Calc (NIH): 94 mg/dL (ref 0–99)
Triglycerides: 178 mg/dL — ABNORMAL HIGH (ref 0–149)
VLDL Cholesterol Cal: 30 mg/dL (ref 5–40)

## 2022-05-19 LAB — COMPREHENSIVE METABOLIC PANEL
ALT: 15 IU/L (ref 0–32)
AST: 22 IU/L (ref 0–40)
Albumin/Globulin Ratio: 2.7 — ABNORMAL HIGH (ref 1.2–2.2)
Albumin: 4.8 g/dL (ref 3.8–4.8)
Alkaline Phosphatase: 97 IU/L (ref 44–121)
BUN/Creatinine Ratio: 17 (ref 12–28)
BUN: 13 mg/dL (ref 8–27)
Bilirubin Total: 0.5 mg/dL (ref 0.0–1.2)
CO2: 23 mmol/L (ref 20–29)
Calcium: 10.2 mg/dL (ref 8.7–10.3)
Chloride: 102 mmol/L (ref 96–106)
Creatinine, Ser: 0.77 mg/dL (ref 0.57–1.00)
Globulin, Total: 1.8 g/dL (ref 1.5–4.5)
Glucose: 92 mg/dL (ref 70–99)
Potassium: 4.3 mmol/L (ref 3.5–5.2)
Sodium: 140 mmol/L (ref 134–144)
Total Protein: 6.6 g/dL (ref 6.0–8.5)
eGFR: 81 mL/min/{1.73_m2} (ref >59)

## 2022-05-19 LAB — CBC WITH DIFFERENTIAL/PLATELET
Basophils Absolute: 0.1 10*3/uL (ref 0.0–0.2)
Basos: 1 %
EOS (ABSOLUTE): 0.1 10*3/uL (ref 0.0–0.4)
Eos: 1 %
Hematocrit: 44.5 % (ref 34.0–46.6)
Hemoglobin: 14.6 g/dL (ref 11.1–15.9)
Immature Grans (Abs): 0 10*3/uL (ref 0.0–0.1)
Immature Granulocytes: 0 %
Lymphocytes Absolute: 1.5 10*3/uL (ref 0.7–3.1)
Lymphs: 21 %
MCH: 31.1 pg (ref 26.6–33.0)
MCHC: 32.8 g/dL (ref 31.5–35.7)
MCV: 95 fL (ref 79–97)
Monocytes Absolute: 0.4 10*3/uL (ref 0.1–0.9)
Monocytes: 6 %
Neutrophils Absolute: 5 10*3/uL (ref 1.4–7.0)
Neutrophils: 71 %
Platelets: 238 10*3/uL (ref 150–450)
RBC: 4.69 x10E6/uL (ref 3.77–5.28)
RDW: 13.7 % (ref 11.7–15.4)
WBC: 7.1 10*3/uL (ref 3.4–10.8)

## 2022-05-19 LAB — HEPATITIS C ANTIBODY: Hep C Virus Ab: NONREACTIVE

## 2022-05-19 LAB — PARATHYROID HORMONE, INTACT (NO CA): PTH: 30 pg/mL (ref 15–65)

## 2022-05-19 LAB — TSH: TSH: 1.76 u[IU]/mL (ref 0.450–4.500)

## 2022-05-19 LAB — VITAMIN B12: Vitamin B-12: 1102 pg/mL (ref 232–1245)

## 2022-05-19 LAB — VITAMIN D 25 HYDROXY (VIT D DEFICIENCY, FRACTURES): Vit D, 25-Hydroxy: 37.6 ng/mL (ref 30.0–100.0)

## 2022-05-19 MED ORDER — VITAMIN D 50 MCG (2000 UT) PO CAPS: 1.0000 | ORAL_CAPSULE | Freq: Every day | ORAL | 3 refills | Status: DC

## 2022-05-19 MED ORDER — ATORVASTATIN CALCIUM 20 MG PO TABS: 20.0000 mg | ORAL_TABLET | Freq: Every day | ORAL | 3 refills | Status: DC

## 2022-05-19 MED ORDER — LEVOTHYROXINE SODIUM 75 MCG PO TABS: 75.0000 ug | ORAL_TABLET | Freq: Every day | ORAL | 3 refills | Status: DC

## 2022-05-19 NOTE — Progress Notes (Signed)
Labs show normal blood sugar liver kidney and electrolytes.  Cholesterol numbers look relatively okay.  Triglycerides or fats are little bit high.  Blood counts normal, vitamin D normal, thyroid normal, B12 level normal.  Still pending or another test called a parathyroid hormone.  We will continue plan to do chest CT and bone density later in the year  Continue your current medications  Try to stay away from a lot of sugars and fatty foods given your triglycerides.  Eat lean portions of meat.  Eat fruits and vegetables regularly.  Limit lots of dairy as this can contribute to the triglycerides but you need about 1200 mg of calcium daily which can come partly from dairy  Regarding osteoporosis as we discussed, get some weightbearing and aerobic exercise regularly.  Continue vitamin D supplement  Consider medication options.  I have included some information below.  Review these and let me know if you want to try something else, or I can refer you to endocrinology for further discussion of other options if desired   Taking medications to treat osteoporosis reduces fracture risk by 50-70%!   1 in 84 American women and 1 in 50 men over age 74 years old have osteoporosis.     Options for therapy:   Bisphosphonate such as alendronate (Fosamax) This is a medication given weekly that strengthens bones by slowing down the rate of bone loss.  This is usually the first-line medication given the lower cost and does not require injection.  Many people tolerate this medication but some do not.  This medicine has to be taken weekly first thing in the morning on empty stomach, and you cannot lie down for an hour after taking the medication.  Risks of the medication includes trouble swallowing the medication, inflammation of the esophagus, gastric ulcers, and rare risk of breakdown of the jawbone.  These medications are usually given for 5 years (3 years if injectable bisphosphonate like Reclast).   Evenity   Compared with bisphosphonates, this type of medication called monoclonal antibodies produce similar or better bone density results and reduces the chance of all types of fractures. Evenity is delivered via a shot under the skin every month.   Recent research indicates there could be a high risk of spinal column fractures after stopping the drug.  A very rare complication of bisphosphonates and denosumab is a break or crack in the middle of the thighbone.  A second rare complication is delayed healing of the jawbone (osteonecrosis of the jaw). This can occur after an invasive dental procedure such as removing a tooth.   Forteo This medication increases bone density and strength, it is a synthetic version of the parathyroid hormone, and it is given as a daily injection for 2 years.  Risk include leg cramps, nausea, dizziness, elevated calcium, joint pain, and rare risk of bone cancer in animal studies.  Prolia This is an injection given twice yearly that prevents bone dissolving osteoclast cells from forming.  Its a good option for women who can't tolerate bisphosphonate.  The advantages not having to take something every day or every month and it is well-tolerated for the most part.  This type of medication is called a monoclonal antibody, so there are risk of low blood calcium, skin infections, rash, and rarer potential risks are breakdown or death of jawbone, and rare risk of fracture of the thigh bone.  This medication is given ongoing.  Calcitonin  This is an old drug that helps prevent  bone loss, used as a daily nasal spray or injection.  It  reduces spinal fractures, but is not effective in other types of fractures so it is usually not the first-line option.  Side effects can include flushing, rash.  There is a small increase in cancer risk with this medication.  Evista This is a selective estrogen receptor modulator (SERM), and is used in breast cancer prevention and treatment.  It is also used  in treatment of osteoporosis.   It is used in women to reduce risk of vertebral fractures.  Side effects include hot flashes, muscle pain, and increased risks of blood clots in the leg.

## 2022-05-19 NOTE — Progress Notes (Signed)
Put in REF [id number] and they will work with her.

## 2022-05-19 NOTE — Progress Notes (Signed)
Remaining test, parathyroid hormone was fine

## 2022-05-24 ENCOUNTER — Ambulatory Visit: Payer: Self-pay

## 2022-05-24 NOTE — Patient Outreach (Signed)
  Care Coordination   Community Resource Consult  Visit Note   05/24/2022 Name: Theresa Harrison MRN: [id number] DOB: [birth year]  Theresa Harrison is a 72 y.o. year old female who sees Tysinger, Camelia Eng, PA-C for primary care. I spoke with  Theresa Harrison by phone today.  What matters to the patients health and wellness today?  Discussed housing options    Goals Addressed             This Visit's Progress    COMPLETED: Care Coordination Activities       Care Coordination Interventions: Discussed patient wants to move into an apartment versus a trailer but is concerned with costs of living and moving too far from family and church home Reviewed option to move into assisted living versus senior apartments Determined patient prefers to stay in her current home, reports she may move in with family if ever needed Education on role of care coordination team - no follow up desired at this time Encouraged patient to contact primary care provider as needed         SDOH assessments and interventions completed:  Yes  SDOH Interventions Today    Flowsheet Row Most Recent Value  SDOH Interventions   Food Insecurity Interventions Intervention Not Indicated  Housing Interventions Intervention Not Indicated  Transportation Interventions Intervention Not Indicated  Utilities Interventions Intervention Not Indicated  Financial Strain Interventions Intervention Not Indicated        Care Coordination Interventions:  Yes, provided   Interventions Today    Flowsheet Row Most Recent Value  Chronic Disease   Chronic disease during today's visit Other  General Interventions   General Interventions Discussed/Reviewed General Interventions Discussed  Education Interventions   Education Provided Provided Education  Provided Verbal Education On Other  [Discussed housing options,  education on care coordination]  Safety Interventions   Safety Discussed/Reviewed  Safety Discussed        Follow up plan: No further intervention required.   Encounter Outcome:  Pt. Visit Completed   Daneen Schick, BSW, CDP Social Worker, Certified Dementia Practitioner [facility]  Care Coordination [phone]

## 2022-05-24 NOTE — Patient Instructions (Signed)
Visit Information  Thank you for taking time to visit with me today. Please don't hesitate to contact me if I can be of assistance to you.   Following are the goals we discussed today:   Goals Addressed             This Visit's Progress    COMPLETED: Care Coordination Activities       Care Coordination Interventions: Discussed patient wants to move into an apartment versus a trailer but is concerned with costs of living and moving too far from family and church home Reviewed option to move into assisted living versus senior apartments Determined patient prefers to stay in her current home, reports she may move in with family if ever needed Education on role of care coordination team - no follow up desired at this time Encouraged patient to contact primary care provider as needed          If you are experiencing a Mental Health or [place] or need someone to talk to, please go to [facility] [address], [place] [phone]) call [phone]  The patient verbalized understanding of instructions, educational materials, and care plan provided today and DECLINED offer to receive copy of patient instructions, educational materials, and care plan.   No further follow up required: Please contact your primary care provider as needed.  Daneen Schick, BSW, CDP Social Worker, Certified Dementia Practitioner [facility]  Care Coordination [phone]

## 2022-06-01 ENCOUNTER — Other Ambulatory Visit: Payer: Self-pay | Admitting: Medical

## 2022-06-01 DIAGNOSIS — M810 Age-related osteoporosis without current pathological fracture: Secondary | ICD-10-CM

## 2022-06-01 DIAGNOSIS — Z0000 Encounter for general adult medical examination without abnormal findings: Secondary | ICD-10-CM

## 2022-06-15 ENCOUNTER — Other Ambulatory Visit: Payer: Self-pay | Admitting: Medical

## 2022-06-23 ENCOUNTER — Telehealth: Payer: Self-pay

## 2022-06-23 NOTE — Telephone Encounter (Signed)
Pt called to advise she would like to go back to fosamax to help with her osteoporosis.

## 2022-06-23 NOTE — Telephone Encounter (Signed)
Pt has been off fosamax for over 20 years. She took fosamax and then year later started having stomach pain, so she quite taking it.  December 2019 is the last time she took anything for her bones. She took Prolia.

## 2022-06-24 NOTE — Telephone Encounter (Signed)
Can we try prolia for patient and see if this is cost effective

## 2022-06-24 NOTE — Telephone Encounter (Signed)
Pt would like to try Evenity and see what her out of pocket cost would be to see if she can afford this

## 2022-06-24 NOTE — Telephone Encounter (Signed)
This is going to cost too much for patient out of pocket. Please advise

## 2022-06-24 NOTE — Telephone Encounter (Signed)
Can you get patient started on evenity and let her know the cost of everything

## 2022-07-01 ENCOUNTER — Telehealth: Payer: Self-pay | Admitting: Medical

## 2022-07-01 NOTE — Telephone Encounter (Signed)
Pt called and states that she would like to proceed with Prolia. Insurance verification form completed and in [place] folder to be signed.

## 2022-07-29 ENCOUNTER — Telehealth: Payer: Self-pay | Admitting: Medical

## 2022-07-29 NOTE — Telephone Encounter (Signed)
Received a call from [employer], [employer], concerning Prolia. The representative was requesting additional information in order to process approval for this non preferred medication. Unfortunately, when original information was provided the rep I gave the information too and I had a language barrier and some on the details were not relayed. Information requested was provided verbally. Rep stated that she could not approve and  information would now move forwarded to a doctor for final review and I would receive final answer via fax. Fax number was confirmed. I was advised that although pt does meet some criteria for approval she still does not meet all. Preferred medications still not ruled out at this point are Pamidronate and Zometa. This information is being forwarded to Theresa Harrison, Practice Administrator who is handling communication for this patient.  I will continue to provide updates as they arrive.

## 2022-07-29 NOTE — Telephone Encounter (Signed)
I talked with patient, she wants to proceed.  Scheduled her for May 7th Tuesday, she will call before she comes.  Please order Prolia.

## 2022-07-29 NOTE — Telephone Encounter (Signed)
Zella Ball the rep from [facility] called back and states that the doctor on the review board has approved her prolia. They will be sending the approval letter via fax shortly. Pt estimated cost will be $311.00  pt needs an appt several days out as it does take a few days for medications to arrive. Pt needs to be advised to call prior to leaving her house so medication can be set out and medication needs to ordered from [facility]. Lafonda Mosses please advise when pt has been contacted and I will moved forward with other steps or I will contact pt once situation has been addressed.

## 2022-08-03 NOTE — Telephone Encounter (Signed)
Prolia was ordered from physician services.

## 2022-08-04 DIAGNOSIS — S82899A Other fracture of unspecified lower leg, initial encounter for closed fracture: Secondary | ICD-10-CM

## 2022-08-04 HISTORY — DX: Other fracture of unspecified lower leg, initial encounter for closed fracture: S82.899A

## 2022-08-10 ENCOUNTER — Other Ambulatory Visit (INDEPENDENT_AMBULATORY_CARE_PROVIDER_SITE_OTHER): Payer: Medicare Other

## 2022-08-10 DIAGNOSIS — M810 Age-related osteoporosis without current pathological fracture: Secondary | ICD-10-CM

## 2022-08-10 MED ORDER — DENOSUMAB 60 MG/ML ~~LOC~~ SOSY
60.0000 mg | PREFILLED_SYRINGE | Freq: Once | SUBCUTANEOUS | Status: AC
  Administered 2022-08-10: 60 mg via SUBCUTANEOUS

## 2022-08-13 ENCOUNTER — Ambulatory Visit (INDEPENDENT_AMBULATORY_CARE_PROVIDER_SITE_OTHER): Payer: Medicare Other | Admitting: Nurse Practitioner

## 2022-08-13 ENCOUNTER — Encounter: Payer: Self-pay | Admitting: Nurse Practitioner

## 2022-08-13 VITALS — BP 124/76 | HR 55 | Wt 111.2 lb

## 2022-08-13 DIAGNOSIS — L299 Pruritus, unspecified: Secondary | ICD-10-CM | POA: Diagnosis not present

## 2022-08-13 DIAGNOSIS — L988 Other specified disorders of the skin and subcutaneous tissue: Secondary | ICD-10-CM | POA: Diagnosis not present

## 2022-08-13 NOTE — Patient Instructions (Signed)
I want you to watch the area and make sure it doesn't seem to be getting bigger or changing. If you notice changes please let us know.

## 2022-08-13 NOTE — Progress Notes (Signed)
  Tollie Eth, DNP, AGNP-c [facility] [address] [place], [place] [address] [phone]  Subjective:   Theresa Harrison is a 72 y.o. female presents to day for evaluation of: Macule.  Leyna presents today with a chief complaint of a black spot on her arm. She initially thought it was a bruise due to its coloration and lack of pain, itching, or burning. However, she notes that the spot does not exhibit typical bruise characteristics such as color change and is not circular. Daralynn recalls possibly hitting her arm on something in the past few weeks, suggesting the mark could be a bruise that has not resolved or a burst blood vessel.   Fatina also brings up ongoing issues with her scalp, describing symptoms of itchiness, flakiness, and dry skin patches, suggesting a need for dermatological consultation.   PMH, Medications, and Allergies reviewed and updated in chart as appropriate.   ROS negative except for what is listed in HPI. Objective:  BP 124/76   Pulse (!) 55   Wt 111 lb 3.2 oz (50.4 kg)   BMI 22.08 kg/m  Physical Exam Vitals and nursing note reviewed.  Constitutional:      Appearance: Normal appearance.  HENT:     Head: Normocephalic.  Eyes:     Conjunctiva/sclera: Conjunctivae normal.  Musculoskeletal:        General: Normal range of motion.  Skin:    General: Skin is warm and dry.          Comments: 7mm tall x 4 mm wide dark macule noted on the upper right arm. Close examination shows vascularity present with no scaling. Border regular with oval shape.    Neurological:     Mental Status: She is alert.           Assessment & Plan:   Problem List Items Addressed This Visit     Skin macule - Primary    The patient presented with a dark macule on her right upper arm, measuring approximately 7x4 millimeters. The spot is flat, non-circular, and has no concerning features. 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 Initially noted earlier this week with no changes since observation.   Plan: - The patient is advised to monitor the spot for any changes in size, shape, or color. - If any changes are observed, the patient should contact the clinic for further evaluation. - No immediate intervention is needed at this time.      Pruritus    The patient reported ongoing issues with itchy, flaky, and dry skin patches on her scalp. The patient has a history of dermatological issues, including cherry angiomas and a previously treated dry spot on her back.  Plan: - Recommend the patient to schedule an appointment with her dermatologist for further evaluation and management of her scalp condition. - In the meantime, the patient may consider using over-the-counter medicated shampoos or moisturizers to alleviate symptoms. I recommend Nizoral shampoo.          Tollie Eth, DNP, AGNP-c 09/01/2022  5:06 PM    History, Medications, Surgery, SDOH, and Family History reviewed and updated as appropriate.

## 2022-08-20 ENCOUNTER — Other Ambulatory Visit: Payer: Self-pay

## 2022-08-20 ENCOUNTER — Encounter (HOSPITAL_BASED_OUTPATIENT_CLINIC_OR_DEPARTMENT_OTHER): Payer: Self-pay | Admitting: Emergency Medicine

## 2022-08-20 DIAGNOSIS — E039 Hypothyroidism, unspecified: Secondary | ICD-10-CM | POA: Diagnosis not present

## 2022-08-20 DIAGNOSIS — S99911A Unspecified injury of right ankle, initial encounter: Secondary | ICD-10-CM | POA: Diagnosis not present

## 2022-08-20 DIAGNOSIS — Z91040 Latex allergy status: Secondary | ICD-10-CM | POA: Insufficient documentation

## 2022-08-20 DIAGNOSIS — W1830XA Fall on same level, unspecified, initial encounter: Secondary | ICD-10-CM | POA: Diagnosis not present

## 2022-08-20 DIAGNOSIS — M9721XA Periprosthetic fracture around internal prosthetic right ankle joint, initial encounter: Secondary | ICD-10-CM | POA: Insufficient documentation

## 2022-08-20 DIAGNOSIS — S82891A Other fracture of right lower leg, initial encounter for closed fracture: Secondary | ICD-10-CM | POA: Diagnosis not present

## 2022-08-20 NOTE — ED Triage Notes (Signed)
Pt presents to ED POV. Pt c/o R ankle pain s/p mechanical fall. Pt denies blood thinners, denies head injury, denies LOC.

## 2022-08-21 ENCOUNTER — Emergency Department (HOSPITAL_BASED_OUTPATIENT_CLINIC_OR_DEPARTMENT_OTHER)
Admission: EM | Admit: 2022-08-21 | Discharge: 2022-08-21 | Disposition: A | Discharge status: Home / Self Care | Payer: Medicare Other | Attending: Emergency Medicine | Admitting: Emergency Medicine

## 2022-08-21 ENCOUNTER — Emergency Department (HOSPITAL_BASED_OUTPATIENT_CLINIC_OR_DEPARTMENT_OTHER): Payer: Medicare Other

## 2022-08-21 DIAGNOSIS — S82891A Other fracture of right lower leg, initial encounter for closed fracture: Secondary | ICD-10-CM

## 2022-08-21 DIAGNOSIS — M25571 Pain in right ankle and joints of right foot: Secondary | ICD-10-CM | POA: Diagnosis not present

## 2022-08-21 NOTE — ED Notes (Signed)
Reviewed AVS with patient, patient expressed understanding of directions, denies further questions at this time. 

## 2022-08-21 NOTE — ED Provider Notes (Signed)
[name] EMERGENCY DEPARTMENT AT [facility] Provider Note  CSN: [id number] Arrival date & time: 08/20/22 2320  Chief Complaint(s) Ankle Pain  HPI Theresa Harrison is a 72 y.o. female     Ankle Pain Location:  Ankle Injury: yes   Mechanism of injury: fall   Fall:    Fall occurred: rolled her ankle.   Impact surface:  Concrete Ankle location:  R ankle Pain details:    Quality:  Aching   Severity:  Moderate   Onset quality:  Sudden   Timing:  Constant   Progression:  Unchanged Chronicity:  New Relieved by:  Immobilization Worsened by:  Bearing weight   Past Medical History Past Medical History:  Diagnosis Date   Hyperlipidemia    Hypothyroid    Patient Active Problem List   Diagnosis Date Noted   Encounter for health maintenance examination in adult 05/18/2022   Foot pain, bilateral 05/18/2022   Serum calcium elevated 05/18/2022   Paresthesia 05/18/2022   Corn of foot 05/18/2022   Pulmonary nodule 05/18/2022   Aortic atherosclerosis (HCC) 05/18/2022   DOE (dyspnea on exertion) 12/01/2021   Skin lesion 12/01/2021   Dehydration 12/01/2021   Lightheaded 10/12/2021   Impacted cerumen of right ear 10/12/2021   Hypothyroidism 10/12/2021   Bradycardia 10/12/2021   Abnormal laboratory test 06/30/2021   Screening for deficiency anemia 12/25/2020   Incontinence of feces 12/25/2020   Screening for heart disease 12/25/2020   Medicare annual wellness visit, subsequent 12/25/2020   Osteoporosis without current pathological fracture 12/25/2020   Postmenopausal 12/25/2020   Estrogen deficiency 12/25/2020   Advanced directives, counseling/discussion 12/25/2020   Hyperlipidemia 09/02/2020   External hemorrhoid 09/02/2020   Vitamin D deficiency 09/02/2020   Change in consistency of stool 09/02/2020   Home Medication(s) Prior to Admission medications   Medication Sig Start Date End Date Taking? Authorizing Provider  acetaminophen (TYLENOL) 500 MG  tablet Take by mouth.    [provider]  atorvastatin (LIPITOR) 20 MG tablet Take 1 tablet (20 mg total) by mouth daily. 05/19/22   Tysinger, Kermit Balo, PA-C  Cholecalciferol (VITAMIN D) 50 MCG (2000 UT) CAPS Take 1 capsule (2,000 Units total) by mouth daily. 05/19/22   Tysinger, Kermit Balo, PA-C  hydrocortisone 2.5 % cream APPLY TOPICALLY TWICE A DAY 06/15/22   Tysinger, Kermit Balo, PA-C  levothyroxine (SYNTHROID) 75 MCG tablet Take 1 tablet (75 mcg total) by mouth daily. 05/19/22 05/19/23  Tysinger, Kermit Balo, PA-C                                                                                                                                    Allergies Ancef [cefazolin] and Latex  Review of Systems Review of Systems As noted in HPI  Physical Exam Vital Signs  I have reviewed the triage vital signs BP 137/87 (BP Location: Right Arm)   Pulse (!) 54   Temp 98.3 F (36.8  C) (Oral)   Resp 18   SpO2 99%   Physical Exam Vitals reviewed.  Constitutional:      General: She is not in acute distress.    Appearance: She is well-developed. She is not diaphoretic.  HENT:     Head: Normocephalic and atraumatic.     Right Ear: External ear normal.     Left Ear: External ear normal.     Nose: Nose normal.  Eyes:     General: No scleral icterus.    Conjunctiva/sclera: Conjunctivae normal.  Neck:     Trachea: Phonation normal.  Cardiovascular:     Rate and Rhythm: Normal rate and regular rhythm.  Pulmonary:     Effort: Pulmonary effort is normal. No respiratory distress.     Breath sounds: No stridor.  Abdominal:     General: There is no distension.  Musculoskeletal:        General: Normal range of motion.     Cervical back: Normal range of motion.     Right ankle: Tenderness present over the lateral malleolus. No base of 5th metatarsal tenderness. Normal pulse.     Left ankle: No base of 5th metatarsal tenderness. Normal pulse.       Legs:  Neurological:     Mental Status: She is  alert and oriented to person, place, and time.  Psychiatric:        Behavior: Behavior normal.     ED Results and Treatments Labs (all labs ordered are listed, but only abnormal results are displayed) Labs Reviewed - No data to display                                                                                                                       EKG  EKG Interpretation  Date/Time:    Ventricular Rate:    PR Interval:    QRS Duration:   QT Interval:    QTC Calculation:   R Axis:     Text Interpretation:         Radiology DG Ankle 2 Views Right  Result Date: 08/21/2022 CLINICAL DATA:  Fall and right ankle pain. EXAM: RIGHT ANKLE - 2 VIEW COMPARISON:  None Available. FINDINGS: Mildly displaced fracture of the tip of the lateral malleolus. No other acute fracture. The bones are osteopenic. No dislocation. The ankle mortise is intact. Soft tissue swelling over the lateral malleolus. A fixation pin is noted in the first metatarsal. No radiopaque foreign object or soft tissue gas. IMPRESSION: Mildly displaced fracture of the tip of the lateral malleolus. Electronically Signed   By: Elgie Collard M.D.   On: 08/21/2022 01:13    Medications Ordered in ED Medications - No data to display  Procedures Procedures  (including critical care time)  Medical Decision Making / ED Course  Click here for ABCD2, HEART and other calculators  Medical Decision Making Amount and/or Complexity of Data Reviewed Radiology: ordered and independent interpretation performed. Decision-making details documented in ED Course.     Right ankle pain s/p fall. No other injuries. Xray notable for fracture of lateral malleolar tip CAM walker  provided.      Final Clinical Impression(s) / ED Diagnoses Final diagnoses:  Closed fracture of right ankle, initial  encounter   The patient appears reasonably screened and/or stabilized for discharge and I doubt any other medical condition or other [facility] requiring further screening, evaluation, or treatment in the ED at this time. I have discussed the findings, Dx and Tx plan with the patient/family who expressed understanding and agree(s) with the plan. Discharge instructions discussed at length. The patient/family was given strict return precautions who verbalized understanding of the instructions. No further questions at time of discharge.  Disposition: Discharge  Condition: Good  ED Discharge Orders     None        Follow Up: Jac Canavan, PA-C [address] [place] [address] [phone]  Call  to schedule an appointment for close follow up  Joen Laura, MD [address] [place] [address] [phone]  Call  to schedule an appointment for close follow up  \         This chart was dictated using voice recognition software.  Despite best efforts to proofread,  errors can occur which can change the documentation meaning.    Nira Conn, MD 08/21/22 [phone]

## 2022-08-26 DIAGNOSIS — M25571 Pain in right ankle and joints of right foot: Secondary | ICD-10-CM | POA: Diagnosis not present

## 2022-08-31 ENCOUNTER — Telehealth: Payer: Self-pay

## 2022-08-31 NOTE — Telephone Encounter (Signed)
Transition Care Management Follow-up Telephone Call Date of discharge and from where: [facility] 5/18 How have you been since you were released from the hospital? On the mend and confined to the couch  Any questions or concerns? No  Items Reviewed: Did the pt receive and understand the discharge instructions provided? Yes  Medications obtained and verified? No  Other? No  Any new allergies since your discharge? No  Dietary orders reviewed? Yes Do you have support at home? No    Follow up appointments reviewed:  PCP Hospital f/u appt confirmed? No  Scheduled to see  on  @ . Specialist Hospital f/u appt confirmed? Yes  Scheduled to see Orthopedist  on  @ . Are transportation arrangements needed? No  If their condition worsens, is the pt aware to call PCP or go to the Emergency Dept.? Yes Was the patient provided with contact information for the PCP's office or ED? Yes Was to pt encouraged to call back with questions or concerns? Yes

## 2022-09-01 ENCOUNTER — Encounter: Payer: Self-pay | Admitting: Nurse Practitioner

## 2022-09-01 DIAGNOSIS — L299 Pruritus, unspecified: Secondary | ICD-10-CM | POA: Insufficient documentation

## 2022-09-01 NOTE — Assessment & Plan Note (Signed)
The patient reported ongoing issues with itchy, flaky, and dry skin patches on her scalp. The patient has a history of dermatological issues, including cherry angiomas and a previously treated dry spot on her back.  Plan: - Recommend the patient to schedule an appointment with her dermatologist for further evaluation and management of her scalp condition. - In the meantime, the patient may consider using over-the-counter medicated shampoos or moisturizers to alleviate symptoms. I recommend Nizoral shampoo.

## 2022-09-01 NOTE — Assessment & Plan Note (Signed)
The patient presented with a dark macule on her right upper arm, measuring approximately 7x4 millimeters. The spot is flat, non-circular, and has no concerning features. The patient reported a history of cherry angiomas and a possible recent trauma to the area. Initially noted earlier this week with no changes since observation.   Plan: - The patient is advised to monitor the spot for any changes in size, shape, or color. - If any changes are observed, the patient should contact the clinic for further evaluation. - No immediate intervention is needed at this time.

## 2022-09-16 DIAGNOSIS — M25571 Pain in right ankle and joints of right foot: Secondary | ICD-10-CM | POA: Diagnosis not present

## 2022-09-29 DIAGNOSIS — R600 Localized edema: Secondary | ICD-10-CM | POA: Diagnosis not present

## 2022-09-29 DIAGNOSIS — M79662 Pain in left lower leg: Secondary | ICD-10-CM | POA: Diagnosis not present

## 2022-09-29 DIAGNOSIS — M79661 Pain in right lower leg: Secondary | ICD-10-CM | POA: Diagnosis not present

## 2022-09-29 DIAGNOSIS — I872 Venous insufficiency (chronic) (peripheral): Secondary | ICD-10-CM | POA: Diagnosis not present

## 2022-09-29 DIAGNOSIS — I83893 Varicose veins of bilateral lower extremities with other complications: Secondary | ICD-10-CM | POA: Diagnosis not present

## 2022-10-26 ENCOUNTER — Telehealth: Payer: Self-pay | Admitting: Family Medicine

## 2022-10-26 ENCOUNTER — Other Ambulatory Visit: Payer: Self-pay | Admitting: Medical

## 2022-10-26 DIAGNOSIS — L299 Pruritus, unspecified: Secondary | ICD-10-CM

## 2022-10-26 DIAGNOSIS — R202 Paresthesia of skin: Secondary | ICD-10-CM

## 2022-10-26 DIAGNOSIS — M79671 Pain in right foot: Secondary | ICD-10-CM

## 2022-10-26 NOTE — Telephone Encounter (Signed)
Pt wants referral to neurologist.  She states she has talked with you about this in the past where it feels like something is stuck on the bottom of her feet.  It has progressed all the way up her shin on both legs now with tingling.

## 2022-11-03 ENCOUNTER — Ambulatory Visit (INDEPENDENT_AMBULATORY_CARE_PROVIDER_SITE_OTHER): Payer: Medicare Other | Admitting: Medical

## 2022-11-03 VITALS — BP 110/68 | HR 59 | Wt 111.0 lb

## 2022-11-03 DIAGNOSIS — M810 Age-related osteoporosis without current pathological fracture: Secondary | ICD-10-CM | POA: Diagnosis not present

## 2022-11-03 DIAGNOSIS — G8929 Other chronic pain: Secondary | ICD-10-CM | POA: Diagnosis not present

## 2022-11-03 DIAGNOSIS — M5136 Other intervertebral disc degeneration, lumbar region: Secondary | ICD-10-CM

## 2022-11-03 DIAGNOSIS — R202 Paresthesia of skin: Secondary | ICD-10-CM

## 2022-11-03 DIAGNOSIS — R200 Anesthesia of skin: Secondary | ICD-10-CM | POA: Diagnosis not present

## 2022-11-03 DIAGNOSIS — M51369 Other intervertebral disc degeneration, lumbar region without mention of lumbar back pain or lower extremity pain: Secondary | ICD-10-CM

## 2022-11-03 DIAGNOSIS — M79672 Pain in left foot: Secondary | ICD-10-CM | POA: Diagnosis not present

## 2022-11-03 DIAGNOSIS — M419 Scoliosis, unspecified: Secondary | ICD-10-CM

## 2022-11-03 DIAGNOSIS — Z820 Family history of epilepsy and other diseases of the nervous system: Secondary | ICD-10-CM | POA: Diagnosis not present

## 2022-11-03 DIAGNOSIS — M79671 Pain in right foot: Secondary | ICD-10-CM | POA: Diagnosis not present

## 2022-11-03 NOTE — Progress Notes (Signed)
Subjective:  Theresa Harrison is a 72 y.o. female who presents for Chief Complaint  Patient presents with   discuss neurology referral    Discuss a neurology referral due to having issues with feet, feels like something is stuck to bottom of foot, tingling, some cramping in leg and feels weight in it. Feels like her right leg is bigger than left. Had blood flow and veins checked in legs and feet and everything was fine and was told by vascular to see neurology      Here for ongoing sensation like something is stuck to bottom of feet, tingling, heaviness and weakness of feet.  Gets some cramping in right lower leg.  Right leg seems to be bigger than left.  This has been an ongoing problem for a couple years.  Just recently she had vascular consult with [facility] clinic. She notes they said her blood flow in legs was fine.  They did advise she wear compression hose which she has been doing.  She feels like that really gives her some relief and improvement in how her legs feel.  They advised she see neurology.  In general she exercises with walking around the neighborhood.  However she recently broke her ankle.  She was seen at the emergency department and ultimately followed up with [facility] when orthopedics.  She was recently released after a period of treatment  She has a history of chronic back issues and scoliosis.  Has seen [facility] sc[facility]osis center in the past, last visit back in 2022.  She does get a lot of back pain currently.  She does get some shoulder pain and hip pain.,  Right shoulder and left hip.  History of bursitis of the left hip.  She is somewhat concerned about the numbness in her legs as a potential symptom of ALS.  She notes that her  maternal uncle, mother and brother all passed from ALS.    She is the oldest of 3 children, and 2 brother passed.  Brother had ALS.    She is on Prolia injection for osteoporosis.  No other aggravating or relieving factors.    No  other c/o.  Past Medical History:  Diagnosis Date   Abnormal laboratory test 06/30/2021   Advanced directives, counseling/discussion 12/25/2020   Change in consistency of stool 09/02/2020   Dehydration 12/01/2021   Hyperlipidemia    Hypothyroid    Lightheaded 10/12/2021   Screening for deficiency anemia 12/25/2020   Screening for heart disease 12/25/2020   Current Outpatient Medications on File Prior to Visit  Medication Sig Dispense Refill   acetaminophen (TYLENOL) 500 MG tablet Take by mouth.     atorvastatin (LIPITOR) 20 MG tablet Take 1 tablet (20 mg total) by mouth daily. 90 tablet 3   Cholecalciferol (VITAMIN D) 50 MCG (2000 UT) CAPS Take 1 capsule (2,000 Units total) by mouth daily. 90 capsule 3   hydrocortisone 2.5 % cream APPLY TOPICALLY TWICE A DAY 28 g 1   levothyroxine (SYNTHROID) 75 MCG tablet Take 1 tablet (75 mcg total) by mouth daily. 90 tablet 3   No current facility-administered medications on file prior to visit.     The following portions of the patient's history were reviewed and updated as appropriate: allergies, current medications, past family history, past medical history, past social history, past surgical history and problem list.  ROS Otherwise as in subjective above  Objective: BP 110/68   Pulse (!) 59   Wt 111 lb (50.3 kg)  BMI 22.04 kg/m   General appearance: alert, no distress, well developed, well nourished Legs today nontender with normal range of motion.  No obvious deformity or swelling. She does have some mild varicose veins of the lower legs.  No asymmetry in terms of size or swelling.  No calf tenderness.  Negative Homans. Pulses: 2+ radial pulses, 2+ pedal pulses, normal cap refill Ext: no edema Really good strength of both legs and feet and toes.  Sensation for monofilament and in general seems normal both legs.  DTRs normal.     Assessment: Encounter Diagnoses  Name Primary?   Numbness and tingling of leg Yes   Chronic  pain of both feet    Family hx of ALS (amyotrophic lateral sclerosis)    Scoliosis, unspecified scoliosis type, unspecified spinal region    DDD (degenerative disc disease), lumbar    Osteoporosis, unspecified osteoporosis type, unspecified pathological fracture presence      Plan: We discussed her symptoms and concerns.  She has ongoing paresthesias and pain in her legs and feet for years.  I suspect this has more to do with her history of degenerative disease and lumbar spine and scoliosis and wear and tear of the years.  She is concerned that this could even be related to ALS.  She has strong family history of ALS and thought this could be a symptom or an early sign that she could be getting this as well  I reviewed comprehensive labs we did in February to rule out electrolyte disturbance or B12 deficiency.  She does not drink alcohol.  She is not a diabetic.  So we can rule out some causes of neuropathy in the feet.  She has seen both orthopedics and spine and scoliosis center prior.  She last saw [facility] sc[facility] center in 2022 for back issues and just recently saw [facility] for broken ankle.  We will reach out to see if either 1 of these locations does nerve conduction studies and decide on a referral to either them or to either neurology or podiatry.  She likely has some metatarsalgia in the feet as well.  Osteoporosis-currently on Prolia injection.  Due for repeat bone density test October 2024  Kalika was seen today for discuss neurology referral.  Diagnoses and all orders for this visit:  Numbness and tingling of leg  Chronic pain of both feet  Family hx of ALS (amyotrophic lateral sclerosis)  Scoliosis, unspecified scoliosis type, unspecified spinal region  DDD (degenerative disc disease), lumbar  Osteoporosis, unspecified osteoporosis type, unspecified pathological fracture presence    Follow up: Pending callback

## 2022-11-04 NOTE — Progress Notes (Signed)
Pt will contact [facility] since referral was placed prior to her appt yesterday

## 2022-12-14 ENCOUNTER — Other Ambulatory Visit: Payer: Self-pay | Admitting: Medical

## 2022-12-14 DIAGNOSIS — Z1231 Encounter for screening mammogram for malignant neoplasm of breast: Secondary | ICD-10-CM

## 2023-01-10 ENCOUNTER — Ambulatory Visit (HOSPITAL_COMMUNITY)
Admission: RE | Admit: 2023-01-10 | Discharge: 2023-01-10 | Disposition: A | Discharge status: Home / Self Care | Payer: Medicare Other | Source: Ambulatory Visit | Attending: Medical | Admitting: Medical

## 2023-01-10 DIAGNOSIS — R911 Solitary pulmonary nodule: Secondary | ICD-10-CM | POA: Insufficient documentation

## 2023-01-10 DIAGNOSIS — I700 Atherosclerosis of aorta: Secondary | ICD-10-CM | POA: Diagnosis not present

## 2023-01-11 ENCOUNTER — Ambulatory Visit: Payer: Medicare Other

## 2023-01-11 VITALS — BP 124/80 | HR 59 | Temp 98.0°F | Ht 59.5 in | Wt 111.0 lb

## 2023-01-11 DIAGNOSIS — Z0000 Encounter for general adult medical examination without abnormal findings: Secondary | ICD-10-CM | POA: Diagnosis not present

## 2023-01-11 NOTE — Progress Notes (Signed)
Subjective:   Theresa Harrison is a 72 y.o. female who presents for Medicare Annual (Subsequent) preventive examination.  Visit Complete: In person    Cardiac Risk Factors include: advanced age (>67men, >90 women);dyslipidemia     Objective:    Today's Vitals   01/11/23 1038  BP: 124/80  Pulse: (!) 59  Temp: 98 F (36.7 C)  TempSrc: Oral  SpO2: 99%  Weight: 111 lb (50.3 kg)  Height: 4' 11.5" (1.511 m)   Body mass index is 22.04 kg/m.     01/11/2023   10:59 AM 08/20/2022   11:48 PM 01/08/2022   10:34 AM  Advanced Directives  Does Patient Have a Medical Advance Directive? Yes No Yes  Type of Estate agent of [place];Living will  Healthcare Power of [place];Living will  Copy of Healthcare Power of Attorney in Chart? Yes - validated most recent copy scanned in chart (See row information)  No - copy requested  Would patient like information on creating a medical advance directive?  No - Patient declined     Current Medications (verified) Outpatient Encounter Medications as of 01/11/2023  Medication Sig   acetaminophen (TYLENOL) 500 MG tablet Take by mouth.   atorvastatin (LIPITOR) 20 MG tablet Take 1 tablet (20 mg total) by mouth daily.   Cholecalciferol (VITAMIN D) 50 MCG (2000 UT) CAPS Take 1 capsule (2,000 Units total) by mouth daily. (Patient taking differently: Take 1 capsule by mouth daily. Does not take daily)   hydrocortisone 2.5 % cream APPLY TOPICALLY TWICE A DAY   levothyroxine (SYNTHROID) 75 MCG tablet Take 1 tablet (75 mcg total) by mouth daily.   prednisoLONE acetate (PRED FORTE) 1 % ophthalmic suspension 1 drop 2 (two) times daily. Uses as needed   No facility-administered encounter medications on file as of 01/11/2023.    Allergies (verified) Ancef [cefazolin] and Latex   History: Past Medical History:  Diagnosis Date   Abnormal laboratory test 06/30/2021   Advanced directives, counseling/discussion 12/25/2020    Change in consistency of stool 09/02/2020   Dehydration 12/01/2021   Hyperlipidemia    Hypothyroid    Lightheaded 10/12/2021   Screening for deficiency anemia 12/25/2020   Screening for heart disease 12/25/2020   History reviewed. No pertinent surgical history. Family History  Problem Relation Age of Onset   Thyroid disease Mother    Thyroid disease Brother    Heart disease Neg Hx    Social History   Socioeconomic History   Marital status: Divorced    Spouse name: Not on file   Number of children: Not on file   Years of education: Not on file   Highest education level: Not on file  Occupational History   Not on file  Tobacco Use   Smoking status: Never   Smokeless tobacco: Never  Vaping Use   Vaping status: Never Used  Substance and Sexual Activity   Alcohol use: Not Currently   Drug use: Never   Sexual activity: Not on file  Other Topics Concern   Not on file  Social History Narrative   Lives alone.  Has 2 children, 2 adult sons.   Used to work in Psychologist, clinical for medical office for 38 years.   Walks for exercise.  12/2020.     Social Determinants of Health   Financial Resource Strain: Low Risk  (01/11/2023)   Overall Financial Resource Strain (CARDIA)    Difficulty of Paying Living Expenses: Not hard at all  Food Insecurity: No  Food Insecurity (01/11/2023)   Hunger Vital Sign    Worried About Running Out of Food in the Last Year: Never true    Ran Out of Food in the Last Year: Never true  Transportation Needs: No Transportation Needs (01/11/2023)   PRAPARE - Administrator, Civil Service (Medical): No    Lack of Transportation (Non-Medical): No  Physical Activity: Inactive (01/11/2023)   Exercise Vital Sign    Days of Exercise per Week: 0 days    Minutes of Exercise per Session: 0 min  Stress: No Stress Concern Present (01/11/2023)   [employer] of Occupational Health - Occupational Stress Questionnaire    Feeling of Stress : Not at  all  Social Connections: Moderately Integrated (01/11/2023)   Social Connection and Isolation Panel [NHANES]    Frequency of Communication with Friends and Family: More than three times a week    Frequency of Social Gatherings with Friends and Family: Once a week    Attends Religious Services: More than 4 times per year    Active Member of [employer] or Organizations: Yes    Attends Engineer, structural: More than 4 times per year    Marital Status: Divorced    Tobacco Counseling Counseling given: Not Answered   Clinical Intake:  Pre-visit preparation completed: Yes  Pain : No/denies pain     Nutritional Status: BMI of 19-24  Normal Nutritional Risks: None Diabetes: No  How often do you need to have someone help you when you read instructions, pamphlets, or other written materials from your doctor or pharmacy?: 1 - Never  Interpreter Needed?: No  Information entered by :: NAllen LPN   Activities of Daily Living    01/11/2023   10:40 AM  In your present state of health, do you have any difficulty performing the following activities:  Hearing? 1  Comment slight decrease  Vision? 0  Comment has dry eyes  Difficulty concentrating or making decisions? 0  Walking or climbing stairs? 0  Dressing or bathing? 0  Doing errands, shopping? 0  Preparing Food and eating ? N  Using the Toilet? N  In the past six months, have you accidently leaked urine? Y  Comment with a sneeze, laugh or cough  Do you have problems with loss of bowel control? N  Managing your Medications? N  Managing your Finances? N  Housekeeping or managing your Housekeeping? N    Patient Care Team: Tysinger, Kermit Balo, PA-C as PCP - General (Family Medicine) Maisie Fus, MD as PCP - Cardiology (Cardiology)  Indicate any recent Medical Services you may have received from other than [facility] providers in the past year (date may be approximate).     Assessment:   This is a routine wellness examination  for Theresa Harrison.  Hearing/Vision screen Hearing Screening - Comments:: Slight hearing issues Vision Screening - Comments:: Regular eye exams, [facility]   Goals Addressed             This Visit's Progress    Patient Stated       01/11/2023, maintain weight       Depression Screen    01/11/2023   11:09 AM 05/18/2022    9:44 AM 01/08/2022   10:35 AM 06/30/2021   10:05 AM 12/25/2020   11:46 AM 09/02/2020   12:39 PM  PHQ 2/9 Scores  PHQ - 2 Score 2 0 0 1 0 0  PHQ- 9 Score 5  4  Fall Risk    01/11/2023   11:08 AM 05/18/2022    9:44 AM 01/08/2022   10:35 AM 12/01/2021    8:44 AM 06/30/2021   10:04 AM  Fall Risk   Falls in the past year? 1 0 0 1 1  Number falls in past yr: 0 0 0 0 0  Comment    last balance and slipped and fell in the shower-November 20, 2021   Injury with Fall? 1 0 0 0 1  Comment broke ankle   bruise on her back   Risk for fall due to : Medication side effect No Fall Risks Medication side effect History of fall(s) Impaired balance/gait  Follow up Falls prevention discussed;Falls evaluation completed Falls evaluation completed Falls prevention discussed;Falls evaluation completed;Education provided Falls evaluation completed Falls evaluation completed    MEDICARE RISK AT HOME: Medicare Risk at Home Any stairs in or around the home?: Yes If so, are there any without handrails?: No Home free of loose throw rugs in walkways, pet beds, electrical cords, etc?: Yes Adequate lighting in your home to reduce risk of falls?: Yes Life alert?: No Use of a cane, walker or w/c?: No Grab bars in the bathroom?: No Shower chair or bench in shower?: No Elevated toilet seat or a handicapped toilet?: No  TIMED UP AND GO:  Was the test performed?  Yes  Length of time to ambulate 10 feet: 5 sec Gait steady and fast without use of assistive device    Cognitive Function:        01/11/2023   11:12 AM 01/08/2022   10:49 AM 12/25/2020   11:51 AM  6CIT Screen  What Year? 0  points 0 points 0 points  What month? 0 points 0 points 0 points  What time? 0 points 0 points 0 points  Count back from 20 0 points 0 points 0 points  Months in reverse 0 points 0 points 0 points  Repeat phrase 0 points 0 points 0 points  Total Score 0 points 0 points 0 points    Immunizations Immunization History  Administered Date(s) Administered   Influenza, High Dose Seasonal PF 01/19/2021   Moderna Covid-19 Vaccine Bivalent Booster 58yrs & up 01/28/2021   Moderna SARS-COV2 Booster Vaccination 03/18/2020   Moderna Sars-Covid-2 Vaccination 05/08/2019, 06/05/2019   PNEUMOCOCCAL CONJUGATE-20 02/12/2021    TDAP status: Due, Education has been provided regarding the importance of this vaccine. Advised may receive this vaccine at local pharmacy or Health Dept. Aware to provide a copy of the vaccination record if obtained from local pharmacy or Health Dept. Verbalized acceptance and understanding.  Flu Vaccine status: Due, Education has been provided regarding the importance of this vaccine. Advised may receive this vaccine at local pharmacy or Health Dept. Aware to provide a copy of the vaccination record if obtained from local pharmacy or Health Dept. Verbalized acceptance and understanding.  Pneumococcal vaccine status: Up to date  Covid-19 vaccine status: Information provided on how to obtain vaccines.   Qualifies for Shingles Vaccine? Yes   Zostavax completed No   Shingrix Completed?: No.    Education has been provided regarding the importance of this vaccine. Patient has been advised to call insurance company to determine out of pocket expense if they have not yet received this vaccine. Advised may also receive vaccine at local pharmacy or Health Dept. Verbalized acceptance and understanding.  Screening Tests Health Maintenance  Topic Date Due   Zoster Vaccines- Shingrix (1 of 2) Never done  COVID-19 Vaccine (5 - 2023-24 season) 12/05/2022   Medicare Annual Wellness (AWV)   01/11/2024   MAMMOGRAM  01/22/2024   Colonoscopy  11/05/2029   Pneumonia Vaccine 75+ Years old  Completed   DEXA SCAN  Completed   Hepatitis C Screening  Completed   HPV VACCINES  Aged Out   DTaP/Tdap/Td  Discontinued   INFLUENZA VACCINE  Discontinued    Health Maintenance  Health Maintenance Due  Topic Date Due   Zoster Vaccines- Shingrix (1 of 2) Never done   COVID-19 Vaccine (5 - 2023-24 season) 12/05/2022    Colorectal cancer screening: Type of screening: Colonoscopy. Completed 11/06/2019. Repeat every 10 years  Mammogram status: scheduled for 01/25/2023  Bone Density status: scheduled for 01/12/2023  Lung Cancer Screening: (Low Dose CT Chest recommended if Age 83-80 years, 20 pack-year currently smoking OR have quit w/in 15years.) does not qualify.   Lung Cancer Screening Referral: no  Additional Screening:  Hepatitis C Screening: does qualify; Completed 05/18/2022  Vision Screening: Recommended annual ophthalmology exams for early detection of glaucoma and other disorders of the eye. Is the patient up to date with their annual eye exam?  Yes  Who is the provider or what is the name of the office in which the patient attends annual eye exams? [facility] If pt is not established with a provider, would they like to be referred to a provider to establish care? No .   Dental Screening: Recommended annual dental exams for proper oral hygiene  Diabetic Foot Exam: n/a  Community Resource Referral / Chronic Care Management: CRR required this visit?  No   CCM required this visit?  No     Plan:     I have personally reviewed and noted the following in the patient's chart:   Medical and social history Use of alcohol, tobacco or illicit drugs  Current medications and supplements including opioid prescriptions. Patient is not currently taking opioid prescriptions. Functional ability and status Nutritional status Physical activity Advanced directives List of other  physicians Hospitalizations, surgeries, and ER visits in previous 12 months Vitals Screenings to include cognitive, depression, and falls Referrals and appointments  In addition, I have reviewed and discussed with patient certain preventive protocols, quality metrics, and best practice recommendations. A written personalized care plan for preventive services as well as general preventive health recommendations were provided to patient.     Barb Merino, LPN   40/12/8117   After Visit Summary: (In Person-Printed) AVS printed and given to the patient  Nurse Notes: none

## 2023-01-11 NOTE — Patient Instructions (Addendum)
Theresa Harrison , Thank you for taking time to come for your Medicare Wellness Visit. I appreciate your ongoing commitment to your health goals. Please review the following plan we discussed and let me know if I can assist you in the future.   Referrals/Orders/Follow-Ups/Clinician Recommendations: none  This is a list of the screening recommended for you and due dates:  Health Maintenance  Topic Date Due   Zoster (Shingles) Vaccine (1 of 2) Never done   COVID-19 Vaccine (5 - 2023-24 season) 12/05/2022   Medicare Annual Wellness Visit  01/11/2024   Mammogram  01/22/2024   Colon Cancer Screening  11/05/2029   Pneumonia Vaccine  Completed   DEXA scan (bone density measurement)  Completed   Hepatitis C Screening  Completed   HPV Vaccine  Aged Out   DTaP/Tdap/Td vaccine  Discontinued   Flu Shot  Discontinued    Advanced directives: (In Chart) A copy of your advanced directives are scanned into your chart should your provider ever need it.  Next Medicare Annual Wellness Visit scheduled for next year: Yes  Insert Preventive Care attachment Insert FALL PREVENTION attachment if needed

## 2023-01-12 ENCOUNTER — Ambulatory Visit
Admission: RE | Admit: 2023-01-12 | Discharge: 2023-01-12 | Disposition: A | Discharge status: Home / Self Care | Payer: Self-pay | Source: Ambulatory Visit | Attending: Medical | Admitting: Medical

## 2023-01-12 DIAGNOSIS — E349 Endocrine disorder, unspecified: Secondary | ICD-10-CM | POA: Diagnosis not present

## 2023-01-12 DIAGNOSIS — Z0000 Encounter for general adult medical examination without abnormal findings: Secondary | ICD-10-CM

## 2023-01-12 DIAGNOSIS — M810 Age-related osteoporosis without current pathological fracture: Secondary | ICD-10-CM

## 2023-01-12 DIAGNOSIS — M8588 Other specified disorders of bone density and structure, other site: Secondary | ICD-10-CM | POA: Diagnosis not present

## 2023-01-18 NOTE — Progress Notes (Signed)
Hello Dr. Judyann Munson  I believe you are the reading physician on this bone density test.  She has a prior bone density test in the computer.  Do yall typically scan the same exact areas to compare from year to year  It appears that she had a different area scanned the previous test compared to this 1.  From what I can tell it appears her T-scores have improved.  She was on Fosamax but currently on Prolia  I wanted to touch base with you before I contact the patient  Thanks Kristian Covey, [place]

## 2023-01-19 ENCOUNTER — Telehealth: Payer: Self-pay | Admitting: Medical

## 2023-01-19 NOTE — Telephone Encounter (Signed)
Pt called inquiring about her Bone Density and CT chest results. I have called Radiology to get these read so we can let patient know. We should have it soon.  Pt is aware that we will call her once we get results

## 2023-01-19 NOTE — Telephone Encounter (Signed)
Pt asks if you can give her a call when available.

## 2023-01-20 NOTE — Progress Notes (Signed)
There is a stable left lower lung nodule.  There is cholesterol plaque buildup in the coronary arteries and aorta.  Continue cholesterol medication.  Radiologist recommends repeat CT in 18 to 24 months  Regarding the bone density test, I believe that shows improvements from the prior 1 so we will continue Prolia along with recommendations for weightbearing and aerobic exercise  I sent the radiologist a message asking about some of the findings on the bone density test.  I am still waiting to hear back from them

## 2023-01-25 ENCOUNTER — Ambulatory Visit
Admission: RE | Admit: 2023-01-25 | Discharge: 2023-01-25 | Disposition: A | Discharge status: Home / Self Care | Payer: Medicare Other | Source: Ambulatory Visit | Attending: Medical | Admitting: Medical

## 2023-01-25 DIAGNOSIS — Z1231 Encounter for screening mammogram for malignant neoplasm of breast: Secondary | ICD-10-CM | POA: Diagnosis not present

## 2023-01-27 NOTE — Progress Notes (Signed)
I am happy to report that her mammogram was normal, no worrisome findings.

## 2023-01-28 ENCOUNTER — Ambulatory Visit: Payer: Medicare Other | Admitting: Neurology

## 2023-01-28 ENCOUNTER — Encounter: Payer: Self-pay | Admitting: Neurology

## 2023-01-28 VITALS — BP 137/78 | HR 53 | Ht 59.0 in | Wt 111.0 lb

## 2023-01-28 DIAGNOSIS — R202 Paresthesia of skin: Secondary | ICD-10-CM | POA: Diagnosis not present

## 2023-01-28 MED ORDER — GABAPENTIN 100 MG PO CAPS: 300.0000 mg | ORAL_CAPSULE | Freq: Three times a day (TID) | ORAL | 11 refills | Status: DC

## 2023-01-28 MED ORDER — DULOXETINE HCL 30 MG PO CPEP: 30.0000 mg | ORAL_CAPSULE | Freq: Every day | ORAL | 5 refills | Status: DC

## 2023-01-28 NOTE — Progress Notes (Signed)
Chief Complaint  Patient presents with   Room 14    Pt is here Alone. Pt states that she feels like something is stuck on her feet. Pt states that she has the tingling sensation that is coming up to her ankle. Pt states that she has cramping in her legs as night when she stretches out. Pt states that normally the cramping is in her left leg, but it now going to the right leg. Pt states that her right leg is bigger than her left leg.       ASSESSMENT AND PLAN  Theresa Harrison is a 72 y.o. female   Gradual onset bilateral feet paresthesia Anxiety, chronic insomnia  Mild length-dependent sensory changes, suggestive of peripheral neuropathy, differentiation diagnoses also include lumbar radiculopathy, with her reported right worse than left symptoms, long history of low back pain, scoliosis,  EMG nerve duction study  Cymbalta 30 mg every morning  Gabapentin 100-300 every night  DIAGNOSTIC DATA (LABS, IMAGING, TESTING) - I reviewed patient records, labs, notes, testing and imaging myself where available.   MEDICAL HISTORY:  Theresa Harrison is a 71 year old female, seen in request by her primary care PA Aleen Campi, Kermit Balo for evaluation of bilateral lower extremity paresthesia, initial evaluation January 28, 2023   History is obtained from the patient and review of electronic medical records. I personally reviewed pertinent available imaging films in PACS.   PMHx of  HLD Hypothyroidism  She had long history of scoliosis, chronic low back pain, tends to stay at midline, denies significant radiating pain, she lives alone, active,  Since December 2023, she noticed right foot numbness, mainly at the bottom of right foot, needle prick sensation, by May 2024, also had left foot similar involvement, but the right side is always a bit more obvious than the left side, she barely noticed them during the day when she is busy, most noticed paresthesia at nighttime when she  tried to go to sleep, needle prick burning sensation,  She had long history of chronic insomnia, difficulty falling to sleep and staying asleep, also reported anxiety, multiple joint body achy pain  PHYSICAL EXAM:   Vitals:   01/28/23 0959  Weight: 111 lb (50.3 kg)  Height: 4\' 11"  (1.499 m)      Body mass index is 22.42 kg/m.  PHYSICAL EXAMNIATION:  Gen: NAD, conversant, well nourised, well groomed                     Cardiovascular: Regular rate rhythm, no peripheral edema, warm, nontender. Eyes: Conjunctivae clear without exudates or hemorrhage Neck: Supple, no carotid bruits. Pulmonary: Clear to auscultation bilaterally   NEUROLOGICAL EXAM:  MENTAL STATUS: Speech/cognition: Awake, alert, oriented to history taking and casual conversation CRANIAL NERVES: CN II: Visual fields are full to confrontation. Pupils are round equal and briskly reactive to light. CN III, IV, VI: extraocular movement are normal. No ptosis. CN V: Facial sensation is intact to light touch CN VII: Face is symmetric with normal eye closure  CN VIII: Hearing is normal to causal conversation. CN IX, X: Phonation is normal. CN XI: Head turning and shoulder shrug are intact  MOTOR: Scoliosis, limitation of right upper extremity exam is limited due to right shoulder pain, felt to there was no significant muscle weakness,  REFLEXES: Reflexes are 2+ and symmetric at the biceps, triceps, knees, and trace at ankles. Plantar responses are flexor.  SENSORY: Length-dependent decreased vibratory sensation at the right leg to mid  shin level, left leg to ankle level, mildly length-dependent decreased to pinprick to below ankle level  COORDINATION: There is no trunk or limb dysmetria noted.  GAIT/STANCE: Posture is normal. Gait is steady with normal, able to walk on heels and tiptoe  REVIEW OF SYSTEMS:  Full 14 system review of systems performed and notable only for as above All other review of systems were  negative.   ALLERGIES: Allergies  Allergen Reactions   Ancef [Cefazolin]     Severe, hypotension   Latex Rash    HOME MEDICATIONS: Current Outpatient Medications  Medication Sig Dispense Refill   acetaminophen (TYLENOL) 500 MG tablet Take by mouth.     atorvastatin (LIPITOR) 20 MG tablet Take 1 tablet (20 mg total) by mouth daily. 90 tablet 3   Cholecalciferol (VITAMIN D) 50 MCG (2000 UT) CAPS Take 1 capsule (2,000 Units total) by mouth daily. (Patient taking differently: Take 1 capsule by mouth daily. Does not take daily) 90 capsule 3   hydrocortisone 2.5 % cream APPLY TOPICALLY TWICE A DAY 28 g 1   levothyroxine (SYNTHROID) 75 MCG tablet Take 1 tablet (75 mcg total) by mouth daily. 90 tablet 3   prednisoLONE acetate (PRED FORTE) 1 % ophthalmic suspension 1 drop 2 (two) times daily. Uses as needed (Patient not taking: Reported on 01/28/2023)     No current facility-administered medications for this visit.    PAST MEDICAL HISTORY: Past Medical History:  Diagnosis Date   Abnormal laboratory test 06/30/2021   Advanced directives, counseling/discussion 12/25/2020   Change in consistency of stool 09/02/2020   Dehydration 12/01/2021   Hyperlipidemia    Hypothyroid    Lightheaded 10/12/2021   Screening for deficiency anemia 12/25/2020   Screening for heart disease 12/25/2020    PAST SURGICAL HISTORY: History reviewed. No pertinent surgical history.  FAMILY HISTORY: Family History  Problem Relation Age of Onset   Thyroid disease Mother    Thyroid disease Brother    Heart disease Neg Hx     SOCIAL HISTORY: Social History   Socioeconomic History   Marital status: Divorced    Spouse name: Not on file   Number of children: Not on file   Years of education: Not on file   Highest education level: Not on file  Occupational History   Not on file  Tobacco Use   Smoking status: Never   Smokeless tobacco: Never  Vaping Use   Vaping status: Never Used  Substance and  Sexual Activity   Alcohol use: Not Currently   Drug use: Never   Sexual activity: Not on file  Other Topics Concern   Not on file  Social History Narrative   Lives alone.  Has 2 children, 2 adult sons.   Used to work in Psychologist, clinical for medical office for 38 years.   Walks for exercise.  12/2020.     Social Determinants of Health   Financial Resource Strain: Low Risk  (01/11/2023)   Overall Financial Resource Strain (CARDIA)    Difficulty of Paying Living Expenses: Not hard at all  Food Insecurity: No Food Insecurity (01/11/2023)   Hunger Vital Sign    Worried About Running Out of Food in the Last Year: Never true    Ran Out of Food in the Last Year: Never true  Transportation Needs: No Transportation Needs (01/11/2023)   PRAPARE - Administrator, Civil Service (Medical): No    Lack of Transportation (Non-Medical): No  Physical Activity: Inactive (01/11/2023)  Exercise Vital Sign    Days of Exercise per Week: 0 days    Minutes of Exercise per Session: 0 min  Stress: No Stress Concern Present (01/11/2023)   [employer] of Occupational Health - Occupational Stress Questionnaire    Feeling of Stress : Not at all  Social Connections: Moderately Integrated (01/11/2023)   Social Connection and Isolation Panel [NHANES]    Frequency of Communication with Friends and Family: More than three times a week    Frequency of Social Gatherings with Friends and Family: Once a week    Attends Religious Services: More than 4 times per year    Active Member of [employer] or Organizations: Yes    Attends Banker Meetings: More than 4 times per year    Marital Status: Divorced  Intimate Partner Violence: Not At Risk (01/11/2023)   Humiliation, Afraid, Rape, and Kick questionnaire    Fear of Current or Ex-Partner: No    Emotionally Abused: No    Physically Abused: No    Sexually Abused: No      Levert Feinstein, M.D. Ph.D.  [facility] [address], [address] [place], [place] [address] Ph: [phone] Fax: [phone]  CC:  Tysinger, Kermit Balo, PA-C [address] [place],  [place] [address]  Tysinger, Kermit Balo, PA-C

## 2023-02-01 ENCOUNTER — Telehealth: Payer: Self-pay | Admitting: Internal Medicine

## 2023-02-01 DIAGNOSIS — M810 Age-related osteoporosis without current pathological fracture: Secondary | ICD-10-CM

## 2023-02-01 MED ORDER — DENOSUMAB 60 MG/ML ~~LOC~~ SOSY
60.0000 mg | PREFILLED_SYRINGE | Freq: Once | SUBCUTANEOUS | Status: AC
Start: 2023-02-11 — End: ?

## 2023-02-01 NOTE — Telephone Encounter (Signed)
See Prolia Referral

## 2023-02-20 ENCOUNTER — Other Ambulatory Visit: Payer: Self-pay | Admitting: Neurology

## 2023-02-23 ENCOUNTER — Telehealth: Payer: Self-pay | Admitting: Medical

## 2023-02-23 NOTE — Telephone Encounter (Signed)
Pt is scheduled for Monday November 25th for prolia

## 2023-02-23 NOTE — Telephone Encounter (Signed)
Pt called and states her dentist has cleared her to start getting her prolia shot again.

## 2023-02-24 DIAGNOSIS — M79674 Pain in right toe(s): Secondary | ICD-10-CM | POA: Diagnosis not present

## 2023-02-28 ENCOUNTER — Other Ambulatory Visit (INDEPENDENT_AMBULATORY_CARE_PROVIDER_SITE_OTHER): Payer: Medicare Other

## 2023-02-28 DIAGNOSIS — M810 Age-related osteoporosis without current pathological fracture: Secondary | ICD-10-CM | POA: Diagnosis not present

## 2023-02-28 MED ORDER — DENOSUMAB 60 MG/ML ~~LOC~~ SOSY
60.0000 mg | PREFILLED_SYRINGE | Freq: Once | SUBCUTANEOUS | Status: AC
Start: 2023-02-28 — End: 2023-02-28
  Administered 2023-02-28: 60 mg via SUBCUTANEOUS

## 2023-04-20 ENCOUNTER — Ambulatory Visit: Payer: Medicare Other | Admitting: Neurology

## 2023-04-20 DIAGNOSIS — R202 Paresthesia of skin: Secondary | ICD-10-CM | POA: Diagnosis not present

## 2023-04-20 DIAGNOSIS — R7309 Other abnormal glucose: Secondary | ICD-10-CM | POA: Diagnosis not present

## 2023-04-20 DIAGNOSIS — R7982 Elevated C-reactive protein (CRP): Secondary | ICD-10-CM | POA: Diagnosis not present

## 2023-04-20 DIAGNOSIS — R799 Abnormal finding of blood chemistry, unspecified: Secondary | ICD-10-CM | POA: Diagnosis not present

## 2023-04-20 DIAGNOSIS — G629 Polyneuropathy, unspecified: Secondary | ICD-10-CM | POA: Diagnosis not present

## 2023-04-20 DIAGNOSIS — Z0289 Encounter for other administrative examinations: Secondary | ICD-10-CM

## 2023-04-20 DIAGNOSIS — R748 Abnormal levels of other serum enzymes: Secondary | ICD-10-CM | POA: Diagnosis not present

## 2023-04-20 MED ORDER — DULOXETINE HCL 60 MG PO CPEP: 60.0000 mg | ORAL_CAPSULE | Freq: Every day | ORAL | 3 refills | Status: DC

## 2023-04-20 NOTE — Progress Notes (Signed)
No chief complaint on file.     ASSESSMENT AND PLAN  Theresa Harrison is a 73 y.o. 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  Cymbalta 30 mg every morning was helpful, titrating to 60 mg daily  Gabapentin 100-300 every night for sleep,  Complete laboratory evaluation for treatable causes of peripheral neuropathy,  DIAGNOSTIC DATA (LABS, IMAGING, TESTING) - I reviewed patient records, labs, notes, testing and imaging myself where available.   MEDICAL HISTORY:  Theresa Harrison is a 73 year old female, seen in request by her primary care PA Aleen Campi, Kermit Balo for evaluation of bilateral lower extremity paresthesia, initial evaluation January 28, 2023   History is obtained from the patient and review of electronic medical records. I personally reviewed pertinent available imaging films in PACS.   PMHx of  HLD Hypothyroidism  She had long history of scoliosis, chronic low back pain, tends to stay at midline, denies significant radiating pain, she lives alone, active,  Since December 2023, she noticed right foot numbness, mainly at the bottom of right foot, needle prick sensation, by May 2024, also had left foot similar involvement, but the right side is always a bit more obvious than the left side, she barely noticed them during the day when she is busy, most noticed paresthesia at nighttime when she tried to go to sleep, needle prick burning sensation,  She had long history of chronic insomnia, difficulty falling to sleep and staying asleep, also reported  anxiety, multiple joint body achy pain  UPDATE Apr 20 2023: EMG nerve conduction study today showed only mild abnormality, mild axonal sensorimotor polyneuropathy.  She continue complains of bilateral lower extremity numbness, needle prick uncomfortable sensation,  Gabapentin did not provide significant improvement, will add on Cymbalta 60 mg daily  PHYSICAL EXAM:    04/20/2023    1:26 PM 01/28/2023    9:59 AM 01/11/2023   10:38 AM  Vitals with BMI  Height 4\' 11"  4\' 11"  4' 11.5"  Weight  111 lbs 111 lbs  BMI  22.41 22.05  Systolic  137 124  Diastolic  78 80  Pulse  53 59    PHYSICAL EXAMNIATION:  Gen: NAD, conversant, well nourised, well groomed                     Cardiovascular: Regular rate rhythm, no peripheral edema, warm, nontender. Eyes: Conjunctivae clear without exudates or hemorrhage Neck: Supple, no carotid bruits. Pulmonary: Clear to auscultation bilaterally   NEUROLOGICAL EXAM:  MENTAL STATUS: Speech/cognition: Awake, alert, oriented to history taking and casual conversation CRANIAL NERVES: CN II: Visual fields are full to confrontation. Pupils are round equal and briskly reactive to light. CN III, IV, VI: extraocular movement are normal. No ptosis. CN V: Facial sensation is intact to light touch CN VII: Face is symmetric with normal eye closure  CN VIII: Hearing is normal to causal conversation. CN IX, X: Phonation is normal. CN XI: Head turning and shoulder shrug are intact  MOTOR: Scoliosis, limitation of right upper extremity exam is limited due to right shoulder pain, felt to there was no significant muscle weakness,  REFLEXES: Reflexes are 2+ and symmetric at the biceps,  triceps, knees, and trace at ankles. Plantar responses are flexor.  SENSORY: Length-dependent decreased vibratory sensation at the right leg to mid shin level, left leg to ankle level, mildly length-dependent decreased to pinprick to below ankle level  COORDINATION: There is no  trunk or limb dysmetria noted.  GAIT/STANCE: Posture is normal. Gait is steady with normal, able to walk on heels and tiptoe  REVIEW OF SYSTEMS:  Full 14 system review of systems performed and notable only for as above All other review of systems were negative.   ALLERGIES: Allergies  Allergen Reactions   Ancef [Cefazolin]     Severe, hypotension   Latex Rash    HOME MEDICATIONS: Current Outpatient Medications  Medication Sig Dispense Refill   acetaminophen (TYLENOL) 500 MG tablet Take by mouth.     atorvastatin (LIPITOR) 20 MG tablet Take 1 tablet (20 mg total) by mouth daily. 90 tablet 3   Cholecalciferol (VITAMIN D) 50 MCG (2000 UT) CAPS Take 1 capsule (2,000 Units total) by mouth daily. (Patient taking differently: Take 1 capsule by mouth daily. Does not take daily) 90 capsule 3   DULoxetine (CYMBALTA) 60 MG capsule Take 1 capsule (60 mg total) by mouth daily. 90 capsule 3   gabapentin (NEURONTIN) 100 MG capsule Take 3 capsules (300 mg total) by mouth 3 (three) times daily. 90 capsule 11   hydrocortisone 2.5 % cream APPLY TOPICALLY TWICE A DAY 28 g 1   levothyroxine (SYNTHROID) 75 MCG tablet Take 1 tablet (75 mcg total) by mouth daily. 90 tablet 3   prednisoLONE acetate (PRED FORTE) 1 % ophthalmic suspension 1 drop 2 (two) times daily. Uses as needed (Patient not taking: Reported on 01/28/2023)     Current Facility-Administered Medications  Medication Dose Route Frequency Provider Last Rate Last Admin   denosumab (PROLIA) injection 60 mg  60 mg Subcutaneous Once Jac Canavan, PA-C        PAST MEDICAL HISTORY: Past Medical History:  Diagnosis Date   Abnormal laboratory test 06/30/2021   Advanced directives, counseling/discussion 12/25/2020   Change in consistency of stool 09/02/2020   Dehydration 12/01/2021   Hyperlipidemia    Hypothyroid    Lightheaded 10/12/2021   Screening for deficiency anemia 12/25/2020   Screening for heart disease 12/25/2020    PAST  SURGICAL HISTORY: No past surgical history on file.  FAMILY HISTORY: Family History  Problem Relation Age of Onset   Thyroid disease Mother    Thyroid disease Brother    Heart disease Neg Hx     SOCIAL HISTORY: Social History   Socioeconomic History   Marital status: Divorced    Spouse name: Not on file   Number of children: Not on file   Years of education: Not on file   Highest education level: Not on file  Occupational History   Not on file  Tobacco Use   Smoking status: Never   Smokeless tobacco: Never  Vaping Use   Vaping status: Never Used  Substance and Sexual Activity   Alcohol use: Not Currently   Drug use: Never   Sexual activity: Not on file  Other Topics Concern   Not on file  Social History Narrative   Lives alone.  Has 2 children, 2 adult sons.   Used to work in Psychologist, clinical for medical office for 38 years.   Walks for exercise.  12/2020.     Social Drivers of Health   Financial Resource Strain: Low Risk  (01/11/2023)   Overall Financial  Resource Strain (CARDIA)    Difficulty of Paying Living Expenses: Not hard at all  Food Insecurity: No Food Insecurity (01/11/2023)   Hunger Vital Sign    Worried About Running Out of Food in the Last Year: Never true    Ran Out of Food in the Last Year: Never true  Transportation Needs: No Transportation Needs (01/11/2023)   PRAPARE - Administrator, Civil Service (Medical): No    Lack of Transportation (Non-Medical): No  Physical Activity: Inactive (01/11/2023)   Exercise Vital Sign    Days of Exercise per Week: 0 days    Minutes of Exercise per Session: 0 min  Stress: No Stress Concern Present (01/11/2023)   [employer] of Occupational Health - Occupational Stress Questionnaire    Feeling of Stress : Not at all  Social Connections: Moderately Integrated (01/11/2023)   Social Connection and Isolation Panel [NHANES]    Frequency of Communication with Friends and Family: More than three  times a week    Frequency of Social Gatherings with Friends and Family: Once a week    Attends Religious Services: More than 4 times per year    Active Member of [employer] or Organizations: Yes    Attends Banker Meetings: More than 4 times per year    Marital Status: Divorced  Intimate Partner Violence: Not At Risk (01/11/2023)   Humiliation, Afraid, Rape, and Kick questionnaire    Fear of Current or Ex-Partner: No    Emotionally Abused: No    Physically Abused: No    Sexually Abused: No      Levert Feinstein, M.D. Ph.D.  [facility] [address] [place], [place] [address] Ph: [phone] Fax: [phone]  CC:  Tysinger, Kermit Balo, PA-C [address] [place],  [place] [address]  Tysinger, Kermit Balo, PA-C

## 2023-04-21 NOTE — Procedures (Signed)
Full Name: Taeylor Leiper Gender: Female MRN #: [id number] Date of Birth: 02-21-1951    Visit Date: 04/20/2023 13:22 Age: 73 Years Examining Physician: Dr. Levert Feinstein Referring Physician: Dr. Levert Feinstein Height: 4 feet 11 inch History: 73 year old female with gradual onset bilateral feet paresthesia  Summary of the test:  Nerve conduction study: Bilateral sural and left superficial peroneal sensory responses were normal.  Right superficial peroneal sensory responses showed mildly decreased snap amplitude.  Right median, ulnar sensory responses were normal.  Right tibial motor response showed mildly decreased CMAP amplitude, left tibial, bilateral peroneal to EDB motor responses were normal.  Right median, ulnar motor responses were normal.  Electromyography:  Needle examination of bilateral lower extremity muscles and lumbosacral paraspinal muscles were normal.  Conclusion: This is a slight abnormal study.  There is slight evidence of axonal length-dependent sensory motor polyneuropathy.  Levert Feinstein. M.D. Ph.D.   [facility] [address], [address] [place], [place] [address] Tel: [phone] Fax: [phone]  Verbal informed consent was obtained from the patient, patient was informed of potential risk of procedure, including bruising, bleeding, hematoma formation, infection, muscle weakness, muscle pain, numbness, among others.        MNC    Nerve / Sites Muscle Latency Ref. Amplitude Ref. Rel Amp Segments Distance Velocity Ref. Area    ms ms mV mV %  cm m/s m/s mVms  R Median - APB     Wrist APB 3.5 <=4.4 5.1 >=4.0 100 Wrist - APB 7   20.3     Upper arm APB 6.6  5.2  101 Upper arm - Wrist 20 64 >=49 19.3  R Ulnar - ADM     Wrist ADM 2.7 <=3.3 7.3 >=6.0 100 Wrist - ADM 7   25.4     B.Elbow ADM 4.3  7.3  101 B.Elbow - Wrist 9 56 >=49 24.7     A.Elbow ADM 7.1  6.6  90.8 A.Elbow - B.Elbow 18 63 >=49 24.1  R Peroneal - EDB     Ankle EDB 5.5  <=6.5 2.5 >=2.0 100 Ankle - EDB 9   9.5     Fib head EDB 9.9  2.4  98.2 Fib head - Ankle 24.6 55 >=44 10.8     Pop fossa EDB 12.9  2.2  90.7 Pop fossa - Fib head 11.6 39 >=44 9.6         Pop fossa - Ankle      L Peroneal - EDB     Ankle EDB 4.5 <=6.5 2.0 >=2.0 100 Ankle - EDB 9   8.7     Fib head EDB 9.8  2.1  104 Fib head - Ankle 25 47 >=44 9.5     Pop fossa EDB 11.7  2.2  103 Pop fossa - Fib head 11 59 >=44 9.4         Pop fossa - Ankle      R Tibial - AH     Ankle AH 5.3 <=5.8 3.2 >=4.0 100 Ankle - AH 9   9.6     Pop fossa AH 11.5  6.1  190 Pop fossa - Ankle 33 53 >=41 20.1  L Tibial - AH     Ankle AH 4.9 <=5.8 5.6 >=4.0 100 Ankle - AH 9   15.8     Pop fossa AH 10.7  7.4  131 Pop fossa - Ankle 34 58 >=41 25.1  SNC    Nerve / Sites Rec. Site Peak Lat Ref.  Amp Ref. Segments Distance    ms ms V V  cm  R Sural - Ankle (Calf)     Calf Ankle 3.3 <=4.4 5 >=6 Calf - Ankle 14  L Sural - Ankle (Calf)     Calf Ankle 3.1 <=4.4 6 >=6 Calf - Ankle 14  R Superficial peroneal - Ankle     Lat leg Ankle 3.1 <=4.4 3 >=6 Lat leg - Ankle 14  L Superficial peroneal - Ankle     Lat leg Ankle 3.5 <=4.4 6 >=6 Lat leg - Ankle 14  R Median - Orthodromic (Dig II, Mid palm)     Dig II Wrist 3.4 <=3.4 13 >=10 Dig II - Wrist 13  R Ulnar - Orthodromic, (Dig V, Mid palm)     Dig V Wrist 2.8 <=3.1 9 >=5 Dig V - Wrist 82                 F  Wave    Nerve F Lat Ref.   ms ms  R Tibial - AH 41.4 <=56.0  L Tibial - AH 44.2 <=56.0  R Ulnar - ADM 23.8 <=32.0           EMG Summary Table    Spontaneous MUAP Recruitment  Muscle IA Fib PSW Fasc Other Amp Dur. Poly Pattern  L. Tibialis posterior Normal None None None _______ Normal Normal Normal Normal  L. Tibialis anterior Normal None None None _______ Normal Normal Normal Normal  L. Peroneus longus Normal None None None _______ Normal Normal Normal Normal  L. Gastrocnemius (Medial head) Normal None None None _______ Normal Normal Normal Normal   L. Vastus lateralis Normal None None None _______ Normal Normal Normal Normal  R. Tibialis anterior Normal None None None _______ Normal Normal Normal Normal  R. Tibialis posterior Normal None None None _______ Normal Normal Normal Normal  R. Peroneus longus Normal None None None _______ Normal Normal Normal Normal  R. Gastrocnemius (Medial head) Normal None None None _______ Normal Normal Normal Normal  R. Vastus lateralis Normal None None None _______ Normal Normal Normal Normal  R. Lumbar paraspinals (low) Normal None None None _______ Normal Normal Normal Normal  R. Lumbar paraspinals (mid) Normal None None None _______ Normal Normal Normal Normal  L. Lumbar paraspinals (low) Normal None None None _______ Normal Normal Normal Normal  L. Lumbar paraspinals (mid) Normal None None None _______ Normal Normal Normal Normal

## 2023-04-27 DIAGNOSIS — G629 Polyneuropathy, unspecified: Secondary | ICD-10-CM | POA: Insufficient documentation

## 2023-04-27 NOTE — Progress Notes (Signed)
Nerve conduction study report is under procedure tab

## 2023-04-28 LAB — MULTIPLE MYELOMA PANEL, SERUM
Albumin SerPl Elph-Mcnc: 4 g/dL (ref 2.9–4.4)
Albumin/Glob SerPl: 1.8 — ABNORMAL HIGH (ref 0.7–1.7)
Alpha 1: 0.2 g/dL (ref 0.0–0.4)
Alpha2 Glob SerPl Elph-Mcnc: 0.7 g/dL (ref 0.4–1.0)
B-Globulin SerPl Elph-Mcnc: 0.8 g/dL (ref 0.7–1.3)
Gamma Glob SerPl Elph-Mcnc: 0.6 g/dL (ref 0.4–1.8)
Globulin, Total: 2.3 g/dL (ref 2.2–3.9)
IgA/Immunoglobulin A, Serum: 112 mg/dL (ref 64–422)
IgG (Immunoglobin G), Serum: 607 mg/dL (ref 586–1602)
IgM (Immunoglobulin M), Srm: 60 mg/dL (ref 26–217)
Total Protein: 6.3 g/dL (ref 6.0–8.5)

## 2023-04-28 LAB — ANA W/REFLEX IF POSITIVE: Anti Nuclear Antibody (ANA): NEGATIVE

## 2023-04-28 LAB — C-REACTIVE PROTEIN: CRP: <1 mg/L (ref 0–10)

## 2023-04-28 LAB — CK: Total CK: 69 U/L (ref 32–182)

## 2023-04-28 LAB — FOLATE: Folate: 9.9 ng/mL (ref >3.0)

## 2023-04-28 LAB — HGB A1C W/O EAG: Hgb A1c MFr Bld: 5.8 % — ABNORMAL HIGH (ref 4.8–5.6)

## 2023-04-28 LAB — SEDIMENTATION RATE: Sed Rate: 2 mm/h (ref 0–40)

## 2023-04-28 LAB — RPR: RPR Ser Ql: NONREACTIVE

## 2023-05-24 ENCOUNTER — Ambulatory Visit (INDEPENDENT_AMBULATORY_CARE_PROVIDER_SITE_OTHER): Payer: Medicare Other | Admitting: Medical

## 2023-05-24 ENCOUNTER — Telehealth: Payer: Self-pay

## 2023-05-24 VITALS — BP 110/68 | HR 61 | Ht 59.5 in | Wt 108.4 lb

## 2023-05-24 DIAGNOSIS — I700 Atherosclerosis of aorta: Secondary | ICD-10-CM | POA: Diagnosis not present

## 2023-05-24 DIAGNOSIS — M810 Age-related osteoporosis without current pathological fracture: Secondary | ICD-10-CM

## 2023-05-24 DIAGNOSIS — Z1389 Encounter for screening for other disorder: Secondary | ICD-10-CM

## 2023-05-24 DIAGNOSIS — R200 Anesthesia of skin: Secondary | ICD-10-CM

## 2023-05-24 DIAGNOSIS — E785 Hyperlipidemia, unspecified: Secondary | ICD-10-CM | POA: Diagnosis not present

## 2023-05-24 DIAGNOSIS — Z1211 Encounter for screening for malignant neoplasm of colon: Secondary | ICD-10-CM

## 2023-05-24 DIAGNOSIS — Z0000 Encounter for general adult medical examination without abnormal findings: Secondary | ICD-10-CM

## 2023-05-24 DIAGNOSIS — G629 Polyneuropathy, unspecified: Secondary | ICD-10-CM

## 2023-05-24 DIAGNOSIS — R202 Paresthesia of skin: Secondary | ICD-10-CM | POA: Diagnosis not present

## 2023-05-24 DIAGNOSIS — E039 Hypothyroidism, unspecified: Secondary | ICD-10-CM | POA: Diagnosis not present

## 2023-05-24 LAB — POCT URINALYSIS DIP (PROADVANTAGE DEVICE)
Bilirubin, UA: NEGATIVE
Blood, UA: NEGATIVE
Glucose, UA: NEGATIVE mg/dL
Ketones, POC UA: NEGATIVE mg/dL
Nitrite, UA: NEGATIVE
Protein Ur, POC: NEGATIVE mg/dL
Specific Gravity, Urine: 1.02
Urobilinogen, Ur: NEGATIVE
pH, UA: 6 (ref 5.0–8.0)

## 2023-05-24 LAB — LIPID PANEL

## 2023-05-24 NOTE — Telephone Encounter (Signed)
error 

## 2023-05-24 NOTE — Progress Notes (Signed)
Subjective:   HPI  Theresa Harrison is a 73 y.o. female who presents for Chief Complaint  Patient presents with   Annual Exam    Fasting cpe, no concerns    Patient Care Team: Gennifer Potenza, Kermit Balo, PA-C as PCP - General (Family Medicine) Maisie Fus, MD as PCP - Cardiology (Cardiology) Levert Feinstein, MD as Consulting Physician (Neurology) Sees dentist  Sees eye doctor Delbert Harness, ortho  Concerns: Not exercising.  Being lazy.  Fasting today  Back in 08/2022, stepped off curb wrong and broke right ankle.  Saw ED, was placed in boot, and had follow up with [facility] ortho.  Compliant with thyroid medication  Compliant with statin  Saw neurology this past year, and doing great now on gabapentin and Cymbalta   Reviewed their medical, surgical, family, social, medication, and allergy history and updated chart as appropriate.  Allergies  Allergen Reactions   Ancef [Cefazolin]     Severe, hypotension   Latex Rash    Past Medical History:  Diagnosis Date   Advanced directives, counseling/discussion 12/25/2020   Ankle fracture 08/2022   right; stepped wrong over curb   Change in consistency of stool 09/02/2020   Dehydration 12/01/2021   Hyperlipidemia    Hypothyroid    Lightheaded 10/12/2021   Screening for deficiency anemia 12/25/2020   Screening for heart disease 12/25/2020     Current Outpatient Medications:    acetaminophen (TYLENOL) 500 MG tablet, Take by mouth., Disp: , Rfl:    atorvastatin (LIPITOR) 20 MG tablet, Take 1 tablet (20 mg total) by mouth daily., Disp: 90 tablet, Rfl: 3   Cholecalciferol (VITAMIN D) 50 MCG (2000 UT) CAPS, Take 1 capsule (2,000 Units total) by mouth daily. (Patient taking differently: Take 1 capsule by mouth daily. Does not take daily), Disp: 90 capsule, Rfl: 3   DULoxetine (CYMBALTA) 60 MG capsule, Take 1 capsule (60 mg total) by mouth daily., Disp: 90 capsule, Rfl: 3   gabapentin (NEURONTIN) 100 MG capsule, Take 3  capsules (300 mg total) by mouth 3 (three) times daily., Disp: 90 capsule, Rfl: 11   hydrocortisone 2.5 % cream, APPLY TOPICALLY TWICE A DAY, Disp: 28 g, Rfl: 1   levothyroxine (SYNTHROID) 75 MCG tablet, Take 1 tablet (75 mcg total) by mouth daily., Disp: 90 tablet, Rfl: 3   prednisoLONE acetate (PRED FORTE) 1 % ophthalmic suspension, 1 drop 2 (two) times daily. Uses as needed, Disp: , Rfl:   Current Facility-Administered Medications:    denosumab (PROLIA) injection 60 mg, 60 mg, Subcutaneous, Once, [employer], Kermit Balo, PA-C  Family History  Problem Relation Age of Onset   Thyroid disease Mother    Thyroid disease Brother    Heart disease Neg Hx     Past Surgical History:  Procedure Laterality Date   COLONOSCOPY  11/06/2019   Dr. Cherlyn Labella, [place], [place];  hemorrhoids, otherwise normal    Review of Systems  Constitutional:  Negative for chills, fever, malaise/fatigue and weight loss.  HENT:  Negative for congestion, ear pain, hearing loss, sore throat and tinnitus.   Eyes:  Negative for blurred vision, pain and redness.  Respiratory:  Negative for cough, hemoptysis and shortness of breath.   Cardiovascular:  Negative for chest pain, palpitations, orthopnea, claudication and leg swelling.  Gastrointestinal:  Negative for abdominal pain, blood in stool, constipation, diarrhea, nausea and vomiting.  Genitourinary:  Negative for dysuria, flank pain, frequency, hematuria and urgency.  Musculoskeletal:  Negative for falls, joint pain and myalgias.  Skin:  Negative for itching and rash.  Neurological:  Negative for dizziness, tingling, speech change, weakness and headaches.  Endo/Heme/Allergies:  Negative for polydipsia. Does not bruise/bleed easily.  Psychiatric/Behavioral:  Negative for depression and memory loss. The patient is not nervous/anxious and does not have insomnia.          Objective:  BP 110/68   Pulse 61   Ht 4' 11.5" (1.511 m)   Wt 108 lb 6.4 oz (49.2 kg)    BMI 21.53 kg/m   General appearance: alert, no distress, WD/WN, Caucasian female Skin: unremarkable, no worrisome lesions HEENT: normocephalic, conjunctiva/corneas normal, sclerae anicteric, PERRLA, EOMi, nares patent, no discharge or erythema, pharynx normal Oral cavity: MMM, tongue normal, teeth normal Neck: supple, no lymphadenopathy, no thyromegaly, no masses, normal ROM, no bruits Chest: non tender, normal shape and expansion Heart: RRR, normal S1, S2, no murmurs Lungs: CTA bilaterally, no wheezes, rhonchi, or rales Abdomen: +bs, soft, non tender, non distended, no masses, no hepatomegaly, no splenomegaly, no bruits Back: non tender, normal ROM, no scoliosis Musculoskeletal: moderate scoliosis, otherwise upper extremities non tender, no obvious deformity, normal ROM throughout, lower extremities non tender, no obvious deformity, normal ROM throughout Extremities: no edema, no cyanosis, no clubbing Pulses: 2+ symmetric, upper and lower extremities, normal cap refill Neurological: alert, oriented x 3, CN2-12 intact, strength normal upper extremities and lower extremities, sensation normal throughout, DTRs 2+ throughout, no cerebellar signs, gait normal Psychiatric: normal affect, behavior normal, pleasant  Breast/gyn/rectal - deferred to gynecology     Assessment and Plan :   Encounter Diagnoses  Name Primary?   Encounter for health maintenance examination in adult Yes   Osteoporosis without current pathological fracture, unspecified osteoporosis type    Numbness and tingling of leg    Neuropathy    Hypothyroidism, unspecified type    Hyperlipidemia, unspecified hyperlipidemia type    Aortic atherosclerosis (HCC)    Screening for hematuria or proteinuria    Screening for colon cancer      This visit was a preventative care visit, also known as wellness visit or routine physical.   Topics typically include healthy lifestyle, diet, exercise, preventative care, vaccinations,  sick and well care, proper use of emergency dept and after hours care, as well as other concerns.     Separate significant issues discussed: Osteoporosis - continue Prolia injection q31mo  Neuropathy - saw neurology this past year, doing great on Gabapentin 100mg , 3 capsules TID  Depression - doing fine on Cymbalta 60mg  daily  Hypothyroidism  - continue levothyroxine 75 mcg daily, updated labs today  Aortic atherosclerosis, Hyperlipidemia - continue Lipitor 20mg  daily, labs today    General Recommendations: Continue to return yearly for your annual wellness and preventative care visits.  This gives [place] a chance to discuss healthy lifestyle, exercise, vaccinations, review your chart record, and perform screenings where appropriate.  I recommend you see your eye doctor yearly for routine vision care.  I recommend you see your dentist yearly for routine dental care including hygiene visits twice yearly.   Vaccination recommendations were reviewed Immunization History  Administered Date(s) Administered   Fluad Trivalent(High Dose 65+) 04/22/2023   Influenza, High Dose Seasonal PF 01/19/2021   Moderna Covid-19 Vaccine Bivalent Booster 6yrs & up 01/28/2021   Moderna SARS-COV2 Booster Vaccination 03/18/2020   Moderna Sars-Covid-2 Vaccination 05/08/2019, 06/05/2019   PNEUMOCOCCAL CONJUGATE-20 02/12/2021   Pfizer(Comirnaty)Fall Seasonal Vaccine 12 years and older 02/27/2022, 04/22/2023    Vaccine recommendations: Tetanus Tdap and Shingrix at your  pharmacy  Yearly flu shot   RSV vaccine at your pharmacy    Screening for cancer: Colon cancer screening: Prior or last colon cancer screen: 11/2019 reviewed, hemorrhoids, otherwise normal  Will send home with FIT testing kit  Breast cancer screening: You should perform a self breast exam monthly.   We reviewed recommendations for regular mammograms and breast cancer screening. Last mammogram: 01/2023 Mammo normal  Skin cancer  screening: Check your skin regularly for new changes, growing lesions, or other lesions of concern Come in for evaluation if you have skin lesions of concern.  Lung cancer screening: If you have a greater than 20 pack year history of tobacco use, then you may qualify for lung cancer screening with a chest CT scan.   Please call your insurance company to inquire about coverage for this test.  Pancreatic cancer: no current screening test is available routinely recommended.  (Risk factors: Smoking, overweight or obese, diabetes, chronic pancreatitis, work Nurse, mental health, Solicitor, 70 year old or greater, female greater than female, African-American, family history of pancreatic cancer, hereditary breast, ovarian, melanoma, Lynch, Peutz-jeghers).  We currently don't have screenings for other cancers besides breast, cervical, colon, and lung cancers.  If you have a strong family history of cancer or have other cancer screening concerns, please let me know.    Bone health: Get at least 150 minutes of aerobic exercise weekly Get weight bearing exercise at least once weekly Bone density test:  A bone density test is an imaging test that uses a type of X-ray to measure the amount of calcium and other minerals in your bones. The test may be used to diagnose or screen you for a condition that causes weak or thin bones (osteoporosis), predict your risk for a broken bone (fracture), or determine how well your osteoporosis treatment is working. The bone density test is recommended for females 65 and older, or females or males <65 if certain risk factors such as thyroid disease, long term use of steroids such as for asthma or rheumatological issues, vitamin D deficiency, estrogen deficiency, family history of osteoporosis, self or family history of fragility fracture in first degree relative.  01/2023 bone density test abnormal showing osteoporosis   Heart health: Get at least 150 minutes of  aerobic exercise weekly Limit alcohol It is important to maintain a healthy blood pressure and healthy cholesterol numbers  Heart disease screening: Screening for heart disease includes screening for blood pressure, fasting lipids, glucose/diabetes screening, BMI height to weight ratio, reviewed of smoking status, physical activity, and diet.    Goals include blood pressure 120/80 or less, maintaining a healthy lipid/cholesterol profile, preventing diabetes or keeping diabetes numbers under good control, not smoking or using tobacco products, exercising most days per week or at least 150 minutes per week of exercise, and eating healthy variety of fruits and vegetables, healthy oils, and avoiding unhealthy food choices like fried food, fast food, high sugar and high cholesterol foods.    Other tests may possibly include EKG test, CT coronary calcium score, echocardiogram, exercise treadmill stress test.   12/21/2021 echocardiogram reviewed, 60-65% EF, no regional wall motion abnormalities, mild mitral valve regurgitation, mild calcification of aortic valve  12/09/21 CT coronary test with score of 1, some aortic atherosclerosis present, 6mm pulm nodule was present  01/10/23 CT chest showed stable pulmonary nodule 6mm left lower lobe, no repeat advised.   Vascular disease screening: For high risk individuals including smokers, diabetes, patients with known heart disease or high blood pressure, kidney disease,  and others, screening for vascular disease or atherosclerosis of the arteries is available.  Examples may include carotid ultrasound, abdominal aortic ultrasound, ABI blood flow screening in the legs, thoracic aorta screening.   Medical care options: I recommend you continue to seek care here first for routine care.  We try really hard to have available appointments Monday through Friday daytime hours for sick visits, acute visits, and physicals.  Urgent care should be used for after hours and  weekends for significant issues that cannot wait till the next day.  The emergency department should be used for significant potentially life-threatening emergencies.  The emergency department is expensive, can often have long wait times for less significant concerns, so try to utilize primary care, urgent care, or telemedicine when possible to avoid unnecessary trips to the emergency department.  Virtual visits and telemedicine have been introduced since the pandemic started in 2020, and can be convenient ways to receive medical care.  We offer virtual appointments as well to assist you in a variety of options to seek medical care.   Legal Take the time to do a Last Will and Testament, advanced directives including Healthcare Power of Attorney and Living Will documents.  Do not leave your family with burdens that can be handled ahead of time.   Advanced Directives: I recommend you consider completing a Health Care Power of Attorney and Living Will.   These documents respect your wishes and help alleviate burdens on your loved ones if you were to become terminally ill or be in a position to need those documents enforced.    You can complete Advanced Directives yourself, have them notarized, then have copies made for our office, for you and for anybody you feel should have them in safe keeping.  Or, you can have an attorney prepare these documents.   If you haven't updated your Last Will and Testament in a while, it may be worthwhile having an attorney prepare these documents together and save on some costs.       Spiritual and Emotional Health Keeping a healthy spiritual life can help you better manage your physical health. Your spiritual life can help you to cope with any issues that may arise with your physical health.  Balance can keep [place] healthy and help [place] to recover.  If you are struggling with your spiritual health there are questions that you may want to ask yourself:  What makes me feel  most complete? When do I feel most connected to the rest of the world? Where do I find the most inner strength? What am I doing when I feel whole?  Helpful tips: Being in nature. Some people feel very connected and at peace when they are walking outdoors or are outside. Helping others. Some feel the largest sense of wellbeing when they are of service to others. Being of service can take on many forms. It can be doing volunteer work, being kind to strangers, or offering a hand to a friend in need. Gratitude. Some people find they feel the most connected when they remain grateful. They may make lists of all the things they are grateful for or say a thank you out loud for all they have.    Emotional Health Are you in tune with your emotional health?  Check out this link: http://www.marquez-love.com/   Financial Health Make sure you use a budget for your personal finances Make sure you are insured against risks (health insurance, life insurance, auto insurance, etc) Save more,  spend less Set financial goals If you need help in this area, good resources include counseling through [employer] or other community resources, have a meeting with a Social research officer, government, and a good resource is Salley Slaughter podcast    Autum was seen today for annual exam.  Diagnoses and all orders for this visit:  Encounter for health maintenance examination in adult -     Comprehensive metabolic panel -     CBC -     Lipid panel -     TSH + free T4 -     POCT Urinalysis DIP (Proadvantage Device)  Osteoporosis without current pathological fracture, unspecified osteoporosis type  Numbness and tingling of leg  Neuropathy  Hypothyroidism, unspecified type -     TSH + free T4  Hyperlipidemia, unspecified hyperlipidemia type -     Lipid panel  Aortic atherosclerosis (HCC) -     Lipid panel  Screening for hematuria or proteinuria -     POCT Urinalysis DIP (Proadvantage  Device)  Screening for colon cancer -     Fecal occult blood, imunochemical     Follow-up pending labs, yearly for physical

## 2023-05-24 NOTE — Patient Instructions (Addendum)
 Marland Kitchen

## 2023-05-25 ENCOUNTER — Other Ambulatory Visit: Payer: Self-pay | Admitting: Medical

## 2023-05-25 LAB — COMPREHENSIVE METABOLIC PANEL
ALT: 18 IU/L (ref 0–32)
AST: 24 IU/L (ref 0–40)
Albumin: 4.7 g/dL (ref 3.8–4.8)
Alkaline Phosphatase: 81 IU/L (ref 44–121)
BUN/Creatinine Ratio: 16 (ref 12–28)
BUN: 14 mg/dL (ref 8–27)
Bilirubin Total: 0.4 mg/dL (ref 0.0–1.2)
CO2: 23 mmol/L (ref 20–29)
Calcium: 9.8 mg/dL (ref 8.7–10.3)
Chloride: 100 mmol/L (ref 96–106)
Creatinine, Ser: 0.87 mg/dL (ref 0.57–1.00)
Globulin, Total: 1.8 g/dL (ref 1.5–4.5)
Glucose: 99 mg/dL (ref 70–99)
Potassium: 4.3 mmol/L (ref 3.5–5.2)
Sodium: 141 mmol/L (ref 134–144)
Total Protein: 6.5 g/dL (ref 6.0–8.5)
eGFR: 70 mL/min/{1.73_m2} (ref >59)

## 2023-05-25 LAB — CBC
Hematocrit: 41.3 % (ref 34.0–46.6)
Hemoglobin: 13.7 g/dL (ref 11.1–15.9)
MCH: 31.3 pg (ref 26.6–33.0)
MCHC: 33.2 g/dL (ref 31.5–35.7)
MCV: 94 fL (ref 79–97)
Platelets: 253 10*3/uL (ref 150–450)
RBC: 4.38 x10E6/uL (ref 3.77–5.28)
RDW: 12.2 % (ref 11.7–15.4)
WBC: 6.5 10*3/uL (ref 3.4–10.8)

## 2023-05-25 LAB — LIPID PANEL
Cholesterol, Total: 178 mg/dL (ref 100–199)
HDL: 82 mg/dL (ref >39)
LDL CALC COMMENT:: 2.2 ratio (ref 0.0–4.4)
LDL Chol Calc (NIH): 78 mg/dL (ref 0–99)
Triglycerides: 105 mg/dL (ref 0–149)
VLDL Cholesterol Cal: 18 mg/dL (ref 5–40)

## 2023-05-25 LAB — TSH+FREE T4
Free T4: 1.59 ng/dL (ref 0.82–1.77)
TSH: 1 u[IU]/mL (ref 0.450–4.500)

## 2023-05-25 MED ORDER — VITAMIN D 50 MCG (2000 UT) PO CAPS: 1.0000 | ORAL_CAPSULE | Freq: Every day | ORAL | 2 refills | Status: AC

## 2023-05-25 MED ORDER — LEVOTHYROXINE SODIUM 75 MCG PO TABS: 75.0000 ug | ORAL_TABLET | Freq: Every day | ORAL | 3 refills | Status: AC

## 2023-05-25 MED ORDER — ATORVASTATIN CALCIUM 20 MG PO TABS: 20.0000 mg | ORAL_TABLET | Freq: Every day | ORAL | 3 refills | Status: AC

## 2023-05-25 NOTE — Progress Notes (Signed)
Labs show normal blood sugar, liver, kidney, electrolytes, normal blood counts, normal cholesterol, normal thyroid  Thankfully all the labs are normal  Turn in the stool test.  Continue your other medicines as usual  Glad you are doing well.

## 2023-05-27 DIAGNOSIS — Z1211 Encounter for screening for malignant neoplasm of colon: Secondary | ICD-10-CM | POA: Diagnosis not present

## 2023-06-02 LAB — FECAL OCCULT BLOOD, IMMUNOCHEMICAL: Fecal Occult Bld: NEGATIVE

## 2023-06-03 NOTE — Progress Notes (Signed)
 Thankfully your stool test was negative

## 2023-06-24 ENCOUNTER — Other Ambulatory Visit: Payer: Self-pay | Admitting: Internal Medicine

## 2023-06-24 DIAGNOSIS — M810 Age-related osteoporosis without current pathological fracture: Secondary | ICD-10-CM

## 2023-06-24 MED ORDER — DENOSUMAB 60 MG/ML ~~LOC~~ SOSY
60.0000 mg | PREFILLED_SYRINGE | Freq: Once | SUBCUTANEOUS | Status: AC
Start: 2023-08-29 — End: 2023-08-30
  Administered 2023-08-30: 60 mg via SUBCUTANEOUS

## 2023-07-12 ENCOUNTER — Telehealth: Payer: Self-pay

## 2023-07-12 NOTE — Telephone Encounter (Signed)
 Icd 10 code clarification for [facility] labs

## 2023-07-21 ENCOUNTER — Telehealth: Payer: Self-pay

## 2023-07-21 ENCOUNTER — Other Ambulatory Visit (HOSPITAL_COMMUNITY): Payer: Self-pay

## 2023-07-21 NOTE — Telephone Encounter (Signed)
 Marland Kitchen

## 2023-07-21 NOTE — Telephone Encounter (Signed)
 Pt ready for scheduling for PROLIA on or after : 08/29/23  Option# 1: Buy/Bill (Office supplied medication)  Out-of-pocket cost due at time of clinic visit: $352  Number of injection/visits approved: 2  Primary: UHC MEDICARE Prolia co-insurance: 20% Admin fee co-insurance: $20  Secondary: --- Prolia co-insurance:  Admin fee co-insurance:   Medical Benefit Details: Date Benefits were checked: 07/21/23 Deductible: NO/ Coinsurance: 20%/ Admin Fee: $20  Prior Auth: APPROVED PA# [id number] Expiration Date: 08/29/23-08/28/24  # of doses approved: 2 ----------------------------------------------------------------------- Option# 2- Med Obtained from pharmacy:  Pharmacy benefit: Copay $555 (Paid to pharmacy) Admin Fee: $20 (Pay at clinic)  Prior Auth: N/A PA# Expiration Date:   # of doses approved:   If patient wants fill through the pharmacy benefit please send prescription to: [facility], and include estimated need by date in rx notes. Pharmacy will ship medication directly to the office.  Patient NOT eligible for Prolia Copay Card. Copay Card can make patient's cost as little as $25. Link to apply: [email or web address]://[email or web address].[email or web address]  ** This summary of benefits is an estimation of the patient's out-of-pocket cost. Exact cost may very based on individual plan coverage.

## 2023-07-21 NOTE — Telephone Encounter (Signed)
 Prolia VOB initiated via [email or web address]  Next Prolia inj DUE: 08/29/23

## 2023-07-22 ENCOUNTER — Other Ambulatory Visit: Payer: Self-pay | Admitting: Neurology

## 2023-07-26 NOTE — Telephone Encounter (Signed)
 Bottle says 300mg  tid Last note says: Gabapentin  100-300 every night for sleep   Please clarify

## 2023-08-08 ENCOUNTER — Other Ambulatory Visit: Payer: Self-pay | Admitting: Internal Medicine

## 2023-08-17 DIAGNOSIS — K642 Third degree hemorrhoids: Secondary | ICD-10-CM | POA: Diagnosis not present

## 2023-08-30 ENCOUNTER — Other Ambulatory Visit (INDEPENDENT_AMBULATORY_CARE_PROVIDER_SITE_OTHER)

## 2023-08-30 ENCOUNTER — Telehealth: Payer: Self-pay | Admitting: Internal Medicine

## 2023-08-30 DIAGNOSIS — M810 Age-related osteoporosis without current pathological fracture: Secondary | ICD-10-CM | POA: Diagnosis not present

## 2023-08-30 NOTE — Telephone Encounter (Signed)
 Will set out prolia .  Copied from CRM [id number]. Topic: General - Other >> Aug 30, 2023 11:23 AM Theresa Harrison wrote: Reason for CRM: Patient called to inform the clinic that she will be arriving 15-20 minutes late to her appointment for her Prolia  injection. She stated the delay is due to rainy weather.

## 2023-10-26 ENCOUNTER — Ambulatory Visit: Admitting: Medical

## 2023-10-26 VITALS — BP 120/68 | HR 56 | Wt 106.4 lb

## 2023-10-26 DIAGNOSIS — L989 Disorder of the skin and subcutaneous tissue, unspecified: Secondary | ICD-10-CM

## 2023-10-26 DIAGNOSIS — B079 Viral wart, unspecified: Secondary | ICD-10-CM

## 2023-10-26 NOTE — Progress Notes (Signed)
 Subjective:  Theresa Harrison is a 72 y.o. female who presents for Chief Complaint  Patient presents with   Acute Visit    Hard spot under left eye that she has had for years but nothing hurts about. She now has a spot coming up on right eye.   Here for 2 skin lesions of the face.  She has 1 lesion under her left eye this been there for years unchanged but does not like it.  It is irritating at times.  There is a new lesion that just popped up under the right eyelid and about the same places 1 on the left.  No pain, no other issues.  States that no other aggravating or relieving factors.    No other c/o.  The following portions of the patient's history were reviewed and updated as appropriate: allergies, current medications, past family history, past medical history, past social history, past surgical history and problem list.  ROS Otherwise as in subjective above  Objective: BP 120/68   Pulse (!) 56   Wt 106 lb 6.4 oz (48.3 kg)   SpO2 97%   BMI 21.13 kg/m   General appearance: alert, no distress, well developed, well nourished Left face inferior lateral to eyelid with 4 mm raised verrucoid appearing lesion.  Right face inferior lateral to eyelid with a 2 to 3 mm raised brown somewhat yellowish lesion   Assessment: Encounter Diagnoses  Name Primary?   Skin lesion Yes   Wart of face      Plan: Cryotherapy -discussed risk and benefits of procedure, and patient gave a consent for cryotherapy of the left and right skin lesions noted in exam, both  inferolateral to orbit/eyelid, left and right face.  Used Verruca-Freeze to the 2 lesions as per manufacture guidelines.  Discussed wound care.  Follow-up 87mo  Theresa Harrison was seen today for acute visit.  Diagnoses and all orders for this visit:  Skin lesion  Wart of face    Follow up: 87mo

## 2023-11-29 ENCOUNTER — Ambulatory Visit: Payer: Self-pay | Admitting: Medical

## 2023-11-29 NOTE — Progress Notes (Signed)
 Bone density shows osteoporosis.  I recommend getting weightbearing and aerobic exercise most days per week  I recommend 1200 mg of calcium  daily through diet or supplement.  I recommend continuing vitamin D  supplement 2000 units daily  Continue Prolia   I am assuming she is getting these at the osteoporosis clinic now since we do not do them now.  If not please get her referred over

## 2023-11-30 ENCOUNTER — Ambulatory Visit (INDEPENDENT_AMBULATORY_CARE_PROVIDER_SITE_OTHER): Admitting: Medical

## 2023-11-30 DIAGNOSIS — L989 Disorder of the skin and subcutaneous tissue, unspecified: Secondary | ICD-10-CM

## 2023-11-30 NOTE — Progress Notes (Signed)
 Subjective:  Theresa Harrison is a 73 y.o. female who presents for Chief Complaint  Patient presents with   Follow-up    Recheck on spots on face. Still there on one side of face   Here for recheck on 2 skin lesions of the face.  On her last visit with cryotherapy to 2 lesions, 1 lateral to right on the face and 1 lateral to the left lateral of the face.  She notes that the 1 on the left face is completely gone.  She called that one her beauty mark.  The right face lesion did not really go away.  She is not particularly worried about it.  no other c/o.  The following portions of the patient's history were reviewed and updated as appropriate: allergies, current medications, past family history, past medical history, past social history, past surgical history and problem list.  ROS Otherwise as in subjective above  Objective: There were no vitals taken for this visit.  General appearance: alert, no distress, well developed, well nourished  Left face inferior lateral to eyelid resolved from prior raised 4 mm verrucoid appearing lesion.   Right face inferior lateral to eyelid with a 2 to 3 mm raised lesion, somewhat of a whitehead or cystic looking lesion.   Assessment: Encounter Diagnosis  Name Primary?   Skin lesion of face Yes      Plan: Thankfully the left face verrucal lesion resolved completely with cryotherapy  The right face lesion is cystic appearing, small, but she declines any further therapy to this.      Anjeli was seen today for follow-up.  Diagnoses and all orders for this visit:  Skin lesion of face     Follow up: prn

## 2023-12-15 ENCOUNTER — Telehealth: Payer: Self-pay

## 2023-12-15 NOTE — Telephone Encounter (Signed)
 Auth Submission: APPROVED - Renewal - change of SOC Site of care: Site of care: [facility] Payer: UHC medicare Medication & CPT/J Code(s) submitted: Prolia  (Denosumab ) [id number] Diagnosis Code:  Route of submission (phone, fax, portal): portal Phone # Fax # Auth type: Buy/Bill PB Units/visits requested: 60mg  x 2 doses Reference number: [id number] Approval from: 12/15/23 to 12/14/24

## 2024-01-02 ENCOUNTER — Telehealth: Payer: Self-pay | Admitting: Medical

## 2024-01-02 NOTE — Telephone Encounter (Signed)
 Unable to close Chart. Please correct and close chart   The following users have progress notes with incomplete wildcards or unresolved SmartLists: Moore-Cornwel, Adrienne D

## 2024-01-02 NOTE — Telephone Encounter (Unsigned)
 Copied from CRM #[id number]. Topic: Clinical - Prescription Issue >> Jan 02, 2024 10:09 AM Shelba HERO wrote: Reason for CRM: Patient is calling in to speak with Nurse Samule Louder from the office to discuss these two types of medications that she she takes since January DULoxetine  (CYMBALTA ) 60 MG capsule and gabapentin  (NEURONTIN ) 100 MG capsule she stated that one of the side effects for both is Dizziness, the patient stated that she is not feeling any of that at the moment because when she thinks of dizziness as swimming in the head and she has none of that, but her concern is that she has tripping over things and bumping into corners and door frames, balance seems to be off and the patient stated that 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 Patient stated that the medication is working nicely for her with the depression and her joint pain. Patient stated that sometimes she feels drunk when she does not drink, so she is thinking that it could be other issues. Patient would like a call back from Nurse Sabrina at [phone] ***patient refused E2C2 Nurse Triage

## 2024-01-02 NOTE — Telephone Encounter (Signed)
 Pt was notified and will reach out to neurology

## 2024-01-02 NOTE — Telephone Encounter (Signed)
 Copied from CRM #[id number]. Topic: Clinical - Prescription Issue >> Jan 02, 2024 10:09 AM Shelba HERO wrote: Reason for CRM: Patient is calling in to speak with Nurse Samule Louder from the office to discuss these two types of medications that she she takes since January DULoxetine  (CYMBALTA ) 60 MG capsule and gabapentin  (NEURONTIN ) 100 MG capsule she stated that one of the side effects for both is Dizziness, the patient stated that she is not feeling any of that at the moment because when she thinks of dizziness as swimming in the head and she has none of that, but her concern is that she has tripping over things and bumping into corners and door frames, balance seems to be off and the patient stated that 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 Patient stated that the medication is working nicely for her with the depression and her joint pain. Patient stated that sometimes she feels drunk when she does not drink, so she is thinking that it could be other issues. Patient would like a call back from Nurse Jerie Basford at [phone] ***patient refused E2C2 Nurse Triage

## 2024-01-03 ENCOUNTER — Other Ambulatory Visit: Payer: Self-pay | Admitting: Medical

## 2024-01-03 DIAGNOSIS — Z1231 Encounter for screening mammogram for malignant neoplasm of breast: Secondary | ICD-10-CM

## 2024-01-24 ENCOUNTER — Ambulatory Visit: Payer: Medicare Other

## 2024-01-24 VITALS — BP 124/70 | HR 82 | Temp 98.3°F | Ht 59.0 in | Wt 107.4 lb

## 2024-01-24 DIAGNOSIS — Z0000 Encounter for general adult medical examination without abnormal findings: Secondary | ICD-10-CM

## 2024-01-24 NOTE — Patient Instructions (Addendum)
 Theresa Harrison,  Thank you for taking the time for your Medicare Wellness Visit. I appreciate your continued commitment to your health goals. Please review the care plan we discussed, and feel free to reach out if I can assist you further.  Medicare recommends these wellness visits once per year to help you and your care team stay ahead of potential health issues. These visits are designed to focus on prevention, allowing your provider to concentrate on managing your acute and chronic conditions during your regular appointments.  Please note that Annual Wellness Visits do not include a physical exam. Some assessments may be limited, especially if the visit was conducted virtually. If needed, we may recommend a separate in-person follow-up with your provider.  Ongoing Care Seeing your primary care provider every 3 to 6 months helps us  monitor your health and provide consistent, personalized care.   Referrals If a referral was made during today's visit and you haven't received any updates within two weeks, please contact the referred provider directly to check on the status.  Recommended Screenings:  Health Maintenance  Topic Date Due   Zoster (Shingles) Vaccine (1 of 2) Never done   COVID-19 Vaccine (7 - 2025-26 season) 12/05/2023   Medicare Annual Wellness Visit  01/23/2025   Colon Cancer Screening  11/05/2029   Pneumococcal Vaccine for age over 66  Completed   DEXA scan (bone density measurement)  Completed   Hepatitis C Screening  Completed   Meningitis B Vaccine  Aged Out   DTaP/Tdap/Td vaccine  Discontinued   Flu Shot  Discontinued   Breast Cancer Screening  Discontinued       01/24/2024    1:39 PM  Advanced Directives  Does Patient Have a Medical Advance Directive? Yes  Type of Estate agent of [place];Living will  Copy of Healthcare Power of Attorney in Chart? Yes - validated most recent copy scanned in chart (See row information)   Advance Care  Planning is important because it: Ensures you receive medical care that aligns with your values, goals, and preferences. Provides guidance to your family and loved ones, reducing the emotional burden of decision-making during critical moments.  Vision: Annual vision screenings are recommended for early detection of glaucoma, cataracts, and diabetic retinopathy. These exams can also reveal signs of chronic conditions such as diabetes and high blood pressure.  Dental: Annual dental screenings help detect early signs of oral cancer, gum disease, and other conditions linked to overall health, including heart disease and diabetes.  Please see the attached documents for additional preventive care recommendations.

## 2024-01-24 NOTE — Progress Notes (Signed)
 Subjective:   Theresa Harrison is a 73 y.o. who presents for a Medicare Wellness preventive visit.  As a reminder, Annual Wellness Visits don't include a physical exam, and some assessments may be limited, especially if this visit is performed virtually. We may recommend an in-person follow-up visit with your provider if needed.  Visit Complete: In person    Persons Participating in Visit: Patient.  AWV Questionnaire: Yes: Patient Medicare AWV questionnaire was completed by the patient on 01/20/2024; I have confirmed that all information answered by patient is correct and no changes since this date.  Cardiac Risk Factors include: advanced age (>32men, >5 women);dyslipidemia     Objective:    Today's Vitals   01/24/24 1327  BP: 124/70  Pulse: 82  Temp: 98.3 F (36.8 C)  TempSrc: Oral  SpO2: 95%  Weight: 107 lb 6.4 oz (48.7 kg)  Height: 4' 11 (1.499 m)   Body mass index is 21.69 kg/m.     01/24/2024    1:39 PM 01/11/2023   10:59 AM 08/20/2022   11:48 PM 01/08/2022   10:34 AM  Advanced Directives  Does Patient Have a Medical Advance Directive? Yes Yes No Yes  Type of Estate agent of [place];Living will Healthcare Power of [place];Living will  Healthcare Power of [place];Living will  Copy of Healthcare Power of Attorney in Chart? Yes - validated most recent copy scanned in chart (See row information) Yes - validated most recent copy scanned in chart (See row information)  No - copy requested  Would patient like information on creating a medical advance directive?   No - Patient declined     Current Medications (verified) Outpatient Encounter Medications as of 01/24/2024  Medication Sig   acetaminophen (TYLENOL) 500 MG tablet Take by mouth.   atorvastatin  (LIPITOR) 20 MG tablet Take 1 tablet (20 mg total) by mouth daily.   Cholecalciferol (VITAMIN D ) 50 MCG (2000 UT) CAPS Take 1 capsule (2,000 Units total) by mouth daily. Does not take  daily   DULoxetine  (CYMBALTA ) 60 MG capsule Take 1 capsule (60 mg total) by mouth daily.   gabapentin  (NEURONTIN ) 100 MG capsule TAKE 3 CAPSULES BY MOUTH 3 TIMES DAILY.   hydrocortisone  2.5 % cream APPLY TOPICALLY TWICE A DAY   levothyroxine  (SYNTHROID ) 75 MCG tablet Take 1 tablet (75 mcg total) by mouth daily.   prednisoLONE acetate (PRED FORTE) 1 % ophthalmic suspension 1 drop 2 (two) times daily. Uses as needed   Facility-Administered Encounter Medications as of 01/24/2024  Medication   denosumab  (PROLIA ) injection 60 mg    Allergies (verified) Ancef [cefazolin] and Latex   History: Past Medical History:  Diagnosis Date   Advanced directives, counseling/discussion 12/25/2020   Ankle fracture 08/2022   right; stepped wrong over curb   Change in consistency of stool 09/02/2020   Dehydration 12/01/2021   Hyperlipidemia    Hypothyroid    Lightheaded 10/12/2021   Screening for deficiency anemia 12/25/2020   Screening for heart disease 12/25/2020   Past Surgical History:  Procedure Laterality Date   COLONOSCOPY  11/06/2019   Dr. Elspeth Michaelis, [place], [place];  hemorrhoids, otherwise normal   Family History  Problem Relation Age of Onset   Thyroid  disease Mother    Thyroid  disease Brother    Heart disease Neg Hx    Social History   Socioeconomic History   Marital status: Divorced    Spouse name: Not on file   Number of children: Not on file  Years of education: Not on file   Highest education level: Bachelor's degree (e.g., BA, AB, BS)  Occupational History   Not on file  Tobacco Use   Smoking status: Never   Smokeless tobacco: Never  Vaping Use   Vaping status: Never Used  Substance and Sexual Activity   Alcohol use: Not Currently   Drug use: Never   Sexual activity: Not on file  Other Topics Concern   Not on file  Social History Narrative   Lives alone.  Has 2 children, 2 adult sons.   Used to work in Psychologist, clinical for medical office for 38  years.   Walks for exercise. 05/2023   Social Drivers of Health   Financial Resource Strain: Low Risk  (01/24/2024)   Overall Financial Resource Strain (CARDIA)    Difficulty of Paying Living Expenses: Not hard at all  Food Insecurity: No Food Insecurity (01/24/2024)   Hunger Vital Sign    Worried About Running Out of Food in the Last Year: Never true    Ran Out of Food in the Last Year: Never true  Transportation Needs: No Transportation Needs (01/24/2024)   PRAPARE - Administrator, Civil Service (Medical): No    Lack of Transportation (Non-Medical): No  Physical Activity: Inactive (01/24/2024)   Exercise Vital Sign    Days of Exercise per Week: 0 days    Minutes of Exercise per Session: 0 min  Stress: No Stress Concern Present (01/24/2024)   [employer] of Occupational Health - Occupational Stress Questionnaire    Feeling of Stress: Not at all  Social Connections: Moderately Integrated (01/24/2024)   Social Connection and Isolation Panel    Frequency of Communication with Friends and Family: Twice a week    Frequency of Social Gatherings with Friends and Family: More than three times a week    Attends Religious Services: More than 4 times per year    Active Member of [employer] or Organizations: Yes    Attends Engineer, structural: More than 4 times per year    Marital Status: Divorced    Tobacco Counseling Counseling given: Not Answered    Clinical Intake:  Pre-visit preparation completed: Yes  Pain : No/denies pain     Nutritional Status: BMI of 19-24  Normal Nutritional Risks: None Diabetes: No  Lab Results  Component Value Date   HGBA1C 5.8 (H) 04/20/2023     How often do you need to have someone help you when you read instructions, pamphlets, or other written materials from your doctor or pharmacy?: 1 - Never  Interpreter Needed?: No  Information entered by :: NAllen LPN   Activities of Daily Living     01/20/2024    9:05 AM   In your present state of health, do you have any difficulty performing the following activities:  Hearing? 0  Vision? 1  Comment has dry eyes  Difficulty concentrating or making decisions? 0  Walking or climbing stairs? 0  Dressing or bathing? 0  Doing errands, shopping? 0  Preparing Food and eating ? N  Using the Toilet? N  In the past six months, have you accidently leaked urine? N  Do you have problems with loss of bowel control? N  Managing your Medications? N  Managing your Finances? N  Housekeeping or managing your Housekeeping? N    Patient Care Team: Tysinger, Alm RAMAN, PA-C as PCP - General (Family Medicine) Alvan Ronal BRAVO, MD (Inactive) as PCP - Cardiology (Cardiology)  Onita Duos, MD as Consulting Physician (Neurology)  I have updated your Care Teams any recent Medical Services you may have received from other providers in the past year.     Assessment:   This is a routine wellness examination for Mar[place]ene.  Hearing/Vision screen Hearing Screening - Comments:: Denies hearing issues Vision Screening - Comments:: Regular eye exam, [facility]   Goals Addressed             This Visit's Progress    Patient Stated       01/24/2024, drink more water       Depression Screen     01/24/2024    1:44 PM 05/24/2023    8:35 AM 01/11/2023   11:09 AM 05/18/2022    9:44 AM 01/08/2022   10:35 AM 06/30/2021   10:05 AM 12/25/2020   11:46 AM  PHQ 2/9 Scores  PHQ - 2 Score 0 0 2 0 0 1 0  PHQ- 9 Score   5  4      Fall Risk     01/20/2024    9:05 AM 05/24/2023    8:35 AM 01/11/2023   11:08 AM 05/18/2022    9:44 AM 01/08/2022   10:35 AM  Fall Risk   Falls in the past year? 1 1 1  0 0  Comment tripped on steps twice      Number falls in past yr: 1 0 0 0 0  Injury with Fall? 0 1 1 0 0  Comment   broke ankle    Risk for fall due to : Medication side effect Impaired balance/gait Medication side effect No Fall Risks Medication side effect  Follow up Falls prevention  discussed;Falls evaluation completed Falls evaluation completed Falls prevention discussed;Falls evaluation completed Falls evaluation completed Falls prevention discussed;Falls evaluation completed;Education provided      Data saved with a previous flowsheet row definition    MEDICARE RISK AT HOME:  Medicare Risk at Home Any stairs in or around the home?: (Patient-Rptd) Yes If so, are there any without handrails?: (Patient-Rptd) No Home free of loose throw rugs in walkways, pet beds, electrical cords, etc?: (Patient-Rptd) Yes Adequate lighting in your home to reduce risk of falls?: (Patient-Rptd) Yes Life alert?: (Patient-Rptd) No Use of a cane, walker or w/c?: (Patient-Rptd) No Grab bars in the bathroom?: (Patient-Rptd) No Shower chair or bench in shower?: (Patient-Rptd) No Elevated toilet seat or a handicapped toilet?: (Patient-Rptd) Yes  TIMED UP AND GO:  Was the test performed?  Yes  Length of time to ambulate 10 feet: 5 sec Gait steady and fast without use of assistive device  Cognitive Function: 6CIT completed        01/24/2024    1:45 PM 01/11/2023   11:12 AM 01/08/2022   10:49 AM 12/25/2020   11:51 AM  6CIT Screen  What Year? 0 points 0 points 0 points 0 points  What month? 0 points 0 points 0 points 0 points  What time? 0 points 0 points 0 points 0 points  Count back from 20 0 points 0 points 0 points 0 points  Months in reverse 0 points 0 points 0 points 0 points  Repeat phrase 2 points 0 points 0 points 0 points  Total Score 2 points 0 points 0 points 0 points    Immunizations Immunization History  Administered Date(s) Administered   Fluad Trivalent(High Dose 65+) 04/22/2023   INFLUENZA, HIGH DOSE SEASONAL PF 01/19/2021   Moderna Covid-19 Vaccine Bivalent Booster 47yrs & up 01/28/2021  Moderna SARS-COV2 Booster Vaccination 03/18/2020   Moderna Sars-Covid-2 Vaccination 05/08/2019, 06/05/2019   PNEUMOCOCCAL CONJUGATE-20 02/12/2021   Pfizer(Comirnaty)Fall  Seasonal Vaccine 12 years and older 02/27/2022, 04/22/2023    Screening Tests Health Maintenance  Topic Date Due   Zoster Vaccines- Shingrix (1 of 2) Never done   COVID-19 Vaccine (7 - 2025-26 season) 12/05/2023   Medicare Annual Wellness (AWV)  01/23/2025   Colonoscopy  11/05/2029   Pneumococcal Vaccine: 50+ Years  Completed   DEXA SCAN  Completed   Hepatitis C Screening  Completed   Meningococcal B Vaccine  Aged Out   DTaP/Tdap/Td  Discontinued   Influenza Vaccine  Discontinued   Mammogram  Discontinued    Health Maintenance Items Addressed: Vaccines Due: covid, Declines shingles vaccine.  Additional Screening:  Vision Screening: Recommended annual ophthalmology exams for early detection of glaucoma and other disorders of the eye. Is the patient up to date with their annual eye exam?  Yes  Who is the provider or what is the name of the office in which the patient attends annual eye exams? [facility]  Dental Screening: Recommended annual dental exams for proper oral hygiene  Community Resource Referral / Chronic Care Management: CRR required this visit?  No   CCM required this visit?  No   Plan:    I have personally reviewed and noted the following in the patient's chart:   Medical and social history Use of alcohol, tobacco or illicit drugs  Current medications and supplements including opioid prescriptions. Patient is not currently taking opioid prescriptions. Functional ability and status Nutritional status Physical activity Advanced directives List of other physicians Hospitalizations, surgeries, and ER visits in previous 12 months Vitals Screenings to include cognitive, depression, and falls Referrals and appointments  In addition, I have reviewed and discussed with patient certain preventive protocols, quality metrics, and best practice recommendations. A written personalized care plan for preventive services as well as general preventive health  recommendations were provided to patient.   Ardella FORBES Dawn, LPN   [id number]   After Visit Summary: (In Person-Printed) AVS printed and given to the patient  Notes: Nothing significant to report at this time.

## 2024-01-26 ENCOUNTER — Ambulatory Visit
Admission: RE | Admit: 2024-01-26 | Discharge: 2024-01-26 | Disposition: A | Source: Ambulatory Visit | Attending: Medical | Admitting: Medical

## 2024-01-26 ENCOUNTER — Encounter (HOSPITAL_COMMUNITY): Payer: Self-pay

## 2024-01-26 ENCOUNTER — Ambulatory Visit (HOSPITAL_COMMUNITY)
Admission: EM | Admit: 2024-01-26 | Discharge: 2024-01-26 | Disposition: A | Discharge status: Home / Self Care | Attending: Physician Assistant | Admitting: Physician Assistant

## 2024-01-26 ENCOUNTER — Ambulatory Visit (INDEPENDENT_AMBULATORY_CARE_PROVIDER_SITE_OTHER)

## 2024-01-26 DIAGNOSIS — S62002A Unspecified fracture of navicular [scaphoid] bone of left wrist, initial encounter for closed fracture: Secondary | ICD-10-CM

## 2024-01-26 DIAGNOSIS — M25532 Pain in left wrist: Secondary | ICD-10-CM

## 2024-01-26 DIAGNOSIS — M8580 Other specified disorders of bone density and structure, unspecified site: Secondary | ICD-10-CM | POA: Diagnosis not present

## 2024-01-26 DIAGNOSIS — S80212A Abrasion, left knee, initial encounter: Secondary | ICD-10-CM | POA: Diagnosis not present

## 2024-01-26 DIAGNOSIS — S62022A Displaced fracture of middle third of navicular [scaphoid] bone of left wrist, initial encounter for closed fracture: Secondary | ICD-10-CM | POA: Diagnosis not present

## 2024-01-26 DIAGNOSIS — Z1231 Encounter for screening mammogram for malignant neoplasm of breast: Secondary | ICD-10-CM

## 2024-01-26 MED ORDER — ACETAMINOPHEN 325 MG PO TABS
975.0000 mg | ORAL_TABLET | Freq: Once | ORAL | Status: AC
  Administered 2024-01-26: 975 mg via ORAL

## 2024-01-26 MED ORDER — ACETAMINOPHEN 325 MG PO TABS
ORAL_TABLET | ORAL | Status: AC
  Filled 2024-01-26: qty 3

## 2024-01-26 MED ORDER — PENTAFLUOROPROP-TETRAFLUOROETH EX AERO
INHALATION_SPRAY | CUTANEOUS | Status: AC
  Filled 2024-01-26: qty 30

## 2024-01-26 NOTE — Discharge Instructions (Addendum)
 You were seen today for concerns of an injury to your left wrist. Your imaging demonstrates a fracture to the scaphoid bone (one of the bones in the wrist). We have applied a thumb spica splint to stabilize the hand and wrist and you will need to follow up with a Hand specialist in the next 2-4 days to make sure it is healing appropriately.  We consulted with [facility] regarding your care and they would be more than happy to see you to make sure this is healing well.  You can use Tylenol for pain and it is recommended that you keep the hand elevated and move your fingers frequently to prevent stiffness.  If you start to notice more severe pain, difficulty moving your fingers, discoloration of the fingers, swelling please return here or go to the ER.   [facility] [address], [address], [place], [place] [address] [phone]

## 2024-01-26 NOTE — ED Provider Notes (Signed)
 [facility]    CSN: [id number] Arrival date & time: 01/26/24  1411      History   Chief Complaint Chief Complaint  Patient presents with   Fall    HPI Theresa Harrison is a 72 y.o. female.   HPI  Pt is here for concerns of injuries after a fall. She states she was walking down the stairs and missed a step causing her to fall down the remaining steps. She she states she landed on her hands and knees  She denies hitting her head, LOC, headache, vision changes, nausea or vomiting.  She reports that she is having left hand pain and she scraped up her left knee. She is concerned for a fracture as she has a hx of osteoporosis.  She has not taken anything for her pain   Pt is right hand dominant and plays piano regularly.    Past Medical History:  Diagnosis Date   Advanced directives, counseling/discussion 12/25/2020   Ankle fracture 08/2022   right; stepped wrong over curb   Change in consistency of stool 09/02/2020   Dehydration 12/01/2021   Hyperlipidemia    Hypothyroid    Lightheaded 10/12/2021   Screening for deficiency anemia 12/25/2020   Screening for heart disease 12/25/2020    Patient Active Problem List   Diagnosis Date Noted   Neuropathy 04/27/2023   Numbness and tingling of leg 11/03/2022   Family hx of ALS (amyotrophic lateral sclerosis) 11/03/2022   Chronic pain of both feet 11/03/2022   Scoliosis 11/03/2022   DDD (degenerative disc disease), lumbar 11/03/2022   Encounter for health maintenance examination in adult 05/18/2022   Foot pain, bilateral 05/18/2022   Paresthesia 05/18/2022   Corn of foot 05/18/2022   Pulmonary nodule 05/18/2022   Aortic atherosclerosis 05/18/2022   Impacted cerumen of right ear 10/12/2021   Hypothyroidism 10/12/2021   Bradycardia 10/12/2021   Incontinence of feces 12/25/2020   Medicare annual wellness visit, subsequent 12/25/2020   Osteoporosis without current pathological fracture 12/25/2020    Postmenopausal 12/25/2020   Estrogen deficiency 12/25/2020   Hyperlipidemia 09/02/2020   External hemorrhoid 09/02/2020    Past Surgical History:  Procedure Laterality Date   COLONOSCOPY  11/06/2019   Dr. Elspeth Michaelis, [place], [place];  hemorrhoids, otherwise normal    OB History   No obstetric history on file.      Home Medications    Prior to Admission medications   Medication Sig Start Date End Date Taking? Authorizing Provider  acetaminophen (TYLENOL) 500 MG tablet Take by mouth.    [provider]  atorvastatin  (LIPITOR) 20 MG tablet Take 1 tablet (20 mg total) by mouth daily. 05/25/23   Tysinger, Alm RAMAN, PA-C  Cholecalciferol (VITAMIN D ) 50 MCG (2000 UT) CAPS Take 1 capsule (2,000 Units total) by mouth daily. Does not take daily 05/25/23   Tysinger, Alm RAMAN, PA-C  DULoxetine  (CYMBALTA ) 60 MG capsule Take 1 capsule (60 mg total) by mouth daily. 04/20/23   Onita Duos, MD  gabapentin  (NEURONTIN ) 100 MG capsule TAKE 3 CAPSULES BY MOUTH 3 TIMES DAILY. 07/26/23   Onita Duos, MD  hydrocortisone  2.5 % cream APPLY TOPICALLY TWICE A DAY 06/15/22   Tysinger, Alm RAMAN, PA-C  levothyroxine  (SYNTHROID ) 75 MCG tablet Take 1 tablet (75 mcg total) by mouth daily. 05/25/23 05/24/24  Tysinger, Alm RAMAN, PA-C  prednisoLONE acetate (PRED FORTE) 1 % ophthalmic suspension 1 drop 2 (two) times daily. Uses as needed 10/31/22   [provider]  Family History Family History  Problem Relation Age of Onset   Thyroid  disease Mother    Thyroid  disease Brother    Heart disease Neg Hx    Breast cancer Neg Hx     Social History Social History   Tobacco Use   Smoking status: Never   Smokeless tobacco: Never  Vaping Use   Vaping status: Never Used  Substance Use Topics   Alcohol use: Not Currently   Drug use: Never     Allergies   Ancef [cefazolin] and Latex   Review of Systems Review of Systems   Physical Exam Triage Vital Signs ED Triage Vitals  Encounter Vitals  Group     BP 01/26/24 1441 129/84     Girls Systolic BP Percentile --      Girls Diastolic BP Percentile --      Boys Systolic BP Percentile --      Boys Diastolic BP Percentile --      Pulse Rate 01/26/24 1441 77     Resp 01/26/24 1441 16     Temp 01/26/24 1441 98.1 F (36.7 C)     Temp Source 01/26/24 1441 Oral     SpO2 01/26/24 1441 94 %     Weight --      Height --      Head Circumference --      Peak Flow --      Pain Score 01/26/24 1439 4     Pain Loc --      Pain Education --      Exclude from Growth Chart --    No data found.  Updated Vital Signs BP 129/84 (BP Location: Right Arm)   Pulse 77   Temp 98.1 F (36.7 C) (Oral)   Resp 16   SpO2 94%   Visual Acuity Right Eye Distance:   Left Eye Distance:   Bilateral Distance:    Right Eye Near:   Left Eye Near:    Bilateral Near:     Physical Exam Vitals reviewed.  Constitutional:      General: She is awake.     Appearance: Normal appearance. She is well-developed and well-groomed.  HENT:     Head: Normocephalic and atraumatic.  Eyes:     General: Lids are normal. Gaze aligned appropriately.     Extraocular Movements: Extraocular movements intact.     Conjunctiva/sclera: Conjunctivae normal.  Pulmonary:     Effort: Pulmonary effort is normal.  Musculoskeletal:     Comments: Pt is having pain along the palm of her left hand. She has 4/5 grip strength on the left compared to 5/5 grip strength on the right. She reports pain along the ulnar aspect with supination and pronation of the left wrist. She has 2+ and brisk radial pulses bilaterally.    Skin:    Findings: Abrasion present.      Neurological:     Mental Status: She is alert and oriented to person, place, and time.  Psychiatric:        Attention and Perception: Attention and perception normal.        Mood and Affect: Mood and affect normal.        Speech: Speech normal.        Behavior: Behavior normal. Behavior is cooperative.      UC  Treatments / Results  Labs (all labs ordered are listed, but only abnormal results are displayed) Labs Reviewed - No data to display  EKG   Radiology DG  Wrist Complete Left Result Date: 01/26/2024 CLINICAL DATA:  wrist pain following FOOSH EXAM: LEFT WRIST - COMPLETE 3+ VIEW COMPARISON:  None Available. FINDINGS: Osteopenia.Mildly displaced, transverse fracture through the waist of the scaphoid bone. The distal fragment is displaced 1 mm towards the ulnar aspect. No dislocation. Moderate joint space loss of the first carpometacarpal joint. Soft tissue swelling about the wrist. IMPRESSION: Mildly displaced, transverse fracture through the middle third of the scaphoid bone. The distal fragment is displaced towards the ulnar aspect approximately 1 mm. Orthopedic consultation recommended. Electronically Signed   By: Rogelia Myers M.D.   On: 01/26/2024 16:10    Procedures Procedures (including critical care time)  Medications Ordered in UC Medications  acetaminophen (TYLENOL) tablet 975 mg (975 mg Oral Given 01/26/24 1552)    Initial Impression / Assessment and Plan / UC Course  I have reviewed the triage vital signs and the nursing notes.  Pertinent labs & imaging results that were available during my care of the patient were reviewed by me and considered in my medical decision making (see chart for details).      Final Clinical Impressions(s) / UC Diagnoses   Final diagnoses:  Left wrist pain  Closed displaced fracture of scaphoid of left wrist, unspecified portion of scaphoid, initial encounter  Abrasion of left knee, initial encounter   Patient presents today with concerns for left wrist and hand pain following a FOOSH injury.  She states that she accidentally missed a step and fell down several steps landing on her hands and knees.  She also has an abrasion to the left knee which appears superficial and there does not appear to be underlying swelling.  Imaging completed on the  left wrist indicates a mildly displaced, transverse fracture of the middle third of the scaphoid bone.  Hand specialty was consulted for assistance with management. Dr. Alyse recommended thumb spica splint and follow-up with hand specialist within the next 2 to 5 days.  Patient was apprised of imaging results and was amenable to splinting as well as follow-up with Ortho specialty.  Recommend Tylenol as needed for pain control.  Wound cleansing was conducted on the abrasion.  ED return precautions reviewed and provided in AVS.  Follow-up as needed.    Discharge Instructions      You were seen today for concerns of an injury to your left wrist. Your imaging demonstrates a fracture to the scaphoid bone (one of the bones in the wrist). We have applied a thumb spica splint to stabilize the hand and wrist and you will need to follow up with a Hand specialist in the next 2-4 days to make sure it is healing appropriately.  We consulted with [facility] regarding your care and they would be more than happy to see you to make sure this is healing well.  You can use Tylenol for pain and it is recommended that you keep the hand elevated and move your fingers frequently to prevent stiffness.  If you start to notice more severe pain, difficulty moving your fingers, discoloration of the fingers, swelling please return here or go to the ER.   [facility] [address], [place], [place] [address] [phone]      ED Prescriptions   None    PDMP not reviewed this encounter.   Marylene Rocky BRAVO, PA-C 01/26/24 1821

## 2024-01-26 NOTE — ED Triage Notes (Signed)
 Patient here today with c/o left hand and wrist pain after falling this afternoon. Patient was walking down some steps and missed a step and fell forward. Patient also scraped her left knee.

## 2024-01-31 ENCOUNTER — Ambulatory Visit: Payer: Self-pay | Admitting: Medical

## 2024-02-10 ENCOUNTER — Other Ambulatory Visit: Payer: Self-pay | Admitting: Medical

## 2024-02-10 NOTE — Telephone Encounter (Signed)
 Pt only uses this as needed for hemorrhoids and she has not had this refilled since March of last year

## 2024-03-05 ENCOUNTER — Ambulatory Visit (INDEPENDENT_AMBULATORY_CARE_PROVIDER_SITE_OTHER)

## 2024-03-05 VITALS — BP 122/76 | HR 66 | Temp 98.0°F | Resp 20 | Ht 59.5 in | Wt 108.0 lb

## 2024-03-05 DIAGNOSIS — M810 Age-related osteoporosis without current pathological fracture: Secondary | ICD-10-CM | POA: Diagnosis not present

## 2024-03-05 MED ORDER — DENOSUMAB 60 MG/ML ~~LOC~~ SOSY
60.0000 mg | PREFILLED_SYRINGE | Freq: Once | SUBCUTANEOUS | Status: AC
  Administered 2024-03-05: 60 mg via SUBCUTANEOUS
  Filled 2024-03-05: qty 1

## 2024-03-05 NOTE — Progress Notes (Signed)
 Diagnosis: Osteoporosis  Provider:  Alm Gent, PA-C  Procedure: Injection  Prolia  (Denosumab ), Dose: 60 mg, Site: subcutaneous, Number of injections: 1  Injection Site(s): Left arm  Post Care: Patient declined observation  Discharge: Condition: Stable, Destination: Home . AVS Declined  Performed by:  Rocky FORBES Sar, RN

## 2024-04-18 ENCOUNTER — Telehealth: Payer: Self-pay | Admitting: Internal Medicine

## 2024-04-18 NOTE — Telephone Encounter (Signed)
 Having issue with clinging that's like a small child of a wooden spoon on the bottom of a pot. Pt has an appt next week for this  Copied from CRM [id number]. Topic: General - Other >> Apr 18, 2024  9:08 AM Joesph NOVAK wrote: Reason for CRM: patient would like to speak to Keven Soucy, [phone]. Please fu. Anytime after 12:00pm.

## 2024-04-19 ENCOUNTER — Encounter: Payer: Self-pay | Admitting: Medical

## 2024-04-23 ENCOUNTER — Ambulatory Visit: Admitting: Medical

## 2024-04-23 ENCOUNTER — Encounter: Payer: Self-pay | Admitting: Medical

## 2024-04-23 VITALS — BP 108/68 | HR 64 | Temp 97.9°F | Wt 107.8 lb

## 2024-04-23 DIAGNOSIS — M7062 Trochanteric bursitis, left hip: Secondary | ICD-10-CM

## 2024-04-23 DIAGNOSIS — M25511 Pain in right shoulder: Secondary | ICD-10-CM | POA: Diagnosis not present

## 2024-04-23 DIAGNOSIS — H93299 Other abnormal auditory perceptions, unspecified ear: Secondary | ICD-10-CM | POA: Diagnosis not present

## 2024-04-23 DIAGNOSIS — R519 Headache, unspecified: Secondary | ICD-10-CM | POA: Diagnosis not present

## 2024-04-23 DIAGNOSIS — G8929 Other chronic pain: Secondary | ICD-10-CM

## 2024-04-23 DIAGNOSIS — H9313 Tinnitus, bilateral: Secondary | ICD-10-CM | POA: Diagnosis not present

## 2024-04-23 DIAGNOSIS — G4700 Insomnia, unspecified: Secondary | ICD-10-CM | POA: Diagnosis not present

## 2024-04-23 DIAGNOSIS — H9193 Unspecified hearing loss, bilateral: Secondary | ICD-10-CM

## 2024-04-23 NOTE — Progress Notes (Signed)
 "  Name: Theresa Harrison   Date of Visit: 04/23/24   Date of last visit with me: 02/10/2024   CHIEF COMPLAINT:  Chief Complaint  Patient presents with   Acute Visit    Clinging sound like a child with a wooden spoon banging on a pot. Only hears its every now and then. The only things she hears her bones cracking. She does feel like a bolt of lightening from her head to her neck for a second and she will have pain when moving or reaching for something then the pain disappears. No issues with her eyes anymore.        HPI:  Discussed the use of AI scribe software for clinical note transcription with the patient, who gave verbal consent to proceed.  History of Present Illness   Theresa Harrison is a 74 year old female who presents with auditory hallucinations and neck pain.  She has experienced auditory hallucinations that began with 'bells clanging' and progressed to sounds resembling 'a toddler with a wooden spoon banging on the bottom of a pot or pan.' These symptoms have resolved, with the last occurrence being four days ago. Lying down alleviated the sounds, and she was persistent throughout the day until midnight. Occasional ear pain is noted.  A separate issue, she describes a sensation akin to a 'lightning bolt' originating from her left side down through her neck, which she associates with her spinal cord. This sensation occurred while she was sitting and not engaged in any strenuous activity. She also reports hearing 'cracking of the bones' when turning her head. She has a history of scoliosis and has experienced neck and shoulder pain.   She reports a flare-up of bursitis, initially diagnosed in 2016-2017.  Lately she notes bursitis in the left hip and right shoulder.  She notes difficulty reaching for items and sometimes needs to use her left hand to assist her right arm. Pain is noted when reaching or pressing certain areas of the shoulder.  She is no longer  taking gabapentin  or duloxetine , as she is out of her cast and brace following an orthopedic issue. She is currently engaging in exercises to strengthen her arm and is pleased with her hand function, including playing the piano.  She received a Prolia  shot in early December and COVID and flu vaccinations last week. The auditory symptoms began before the COVID vaccination.  She notes longstanding issues with sleep.  She sleeps sometimes just a few hours per night, often has trouble getting asleep and staying asleep.  She lives alone.  No prior sleep study.  No other aggravating or relieving factors. No other complaint.   Past Medical History:  Diagnosis Date   Advanced directives, counseling/discussion 12/25/2020   Ankle fracture 08/2022   right; stepped wrong over curb   Change in consistency of stool 09/02/2020   Dehydration 12/01/2021   Hyperlipidemia    Hypothyroid    Lightheaded 10/12/2021   Screening for deficiency anemia 12/25/2020   Screening for heart disease 12/25/2020   Medications Ordered Prior to Encounter[1]  ROS as in subjective    Objective: BP 108/68   Pulse 64   Temp 97.9 F (36.6 C)   Wt 107 lb 12.8 oz (48.9 kg)   SpO2 98%   BMI 21.41 kg/m   General appearence: alert, no distress, WD/WN,  HEENT: normocephalic, sclerae anicteric, PERRLA, EOMi, nares patent, no discharge or erythema, pharynx normal, normal TMs and no earwax noted Oral cavity: MMM, no  lesions Neck: supple, no lymphadenopathy, no thyromegaly, no masses, no bruits, nontender, decreased left lateral flexion and rotation otherwise normal neck ROM Mild tenderness of the right upper back paraspinal region, otherwise back nontender but significant obvious scoliosis  MSK: Tender over left greater trochanter bursa otherwise nontender but relatively normal range of motion, nontender to palpation of right shoulder but mild pain noted with range of motion of flexion or abduction over 90 agrees, Otherwise  musculoskeletal nontender, no swelling, no obvious deformity Extremities: no edema, no cyanosis, no clubbing Pulses: 2+ symmetric, upper and lower extremities, normal cap refill Neurological: alert, oriented x 3, CN2-12 intact, strength normal upper extremities and lower extremities, sensation normal throughout, DTRs 2+ throughout, no cerebellar signs, gait normal Psychiatric: normal affect, behavior normal, pleasant      Assessment: Encounter Diagnoses  Name Primary?   Tinnitus of both ears Yes   Trochanteric bursitis of left hip    Chronic right shoulder pain    Bilateral hearing loss, unspecified hearing loss type    Insomnia, unspecified type    Auditory complaints, unspecified laterality    Facial pain       Plan: We discussed her unusual auditory sounds and other symptoms.  Differential includes tinnitus, hearing loss, sleep disorder, other neurological disorder.  Symptoms came and went in 1 day and have not returned.  Discussed possible sleep study versus head imaging versus trial of conservative measures to help with tinnitus.  She declines imaging or sleep study at this time  Tinnitus-discussed using sound machine at night to help with background noise.  We discussed potential causes of tenderness.  She declines referral to ENT at this time.  Reassured no thick earwax on exam  Bursitis of right shoulder and left hip, mild.  Advise short-term NSAID such as Aleve over-the-counter for 5 to 7 days, relative rest and stretching.  If not significantly improved in the next 10 days then consider follow-up with sports medicine Dr. Vita here  Hearing loss, mild to moderate.  Consider formal referral to ENT or audiology.  She declines for now  Insomnia-counseled on sleep hygiene, discussed possible treatment options.  Discussed possible trial of trazodone versus Restoril or other.  She has used Ambien in the remote past.  We discussed risk and benefits of medications.  She is going to  work on having background noise at night to help with tinnitus and possibly help with sleep  We discussed the unusual facial and neck pain which could be related to neck arthritis, less likely trigeminal neuralgia.  Continue stretching regularly  Follow-up next month for physical and recheck on some of these concerns if they persist   Keshonda was seen today for acute visit.  Diagnoses and all orders for this visit:  Tinnitus of both ears  Trochanteric bursitis of left hip  Chronic right shoulder pain  Bilateral hearing loss, unspecified hearing loss type  Insomnia, unspecified type  Auditory complaints, unspecified laterality  Facial pain    F/u in February as scheduled for physical       [1]  Current Outpatient Medications on File Prior to Visit  Medication Sig Dispense Refill   acetaminophen  (TYLENOL ) 500 MG tablet Take by mouth.     atorvastatin  (LIPITOR) 20 MG tablet Take 1 tablet (20 mg total) by mouth daily. 90 tablet 3   Cholecalciferol (VITAMIN D ) 50 MCG (2000 UT) CAPS Take 1 capsule (2,000 Units total) by mouth daily. Does not take daily 90 capsule 2   hydrocortisone  2.5 %  cream APPLY TO AFFECTED AREA TWICE A DAY 28 g 1   levothyroxine  (SYNTHROID ) 75 MCG tablet Take 1 tablet (75 mcg total) by mouth daily. 90 tablet 3   Multiple Vitamins-Minerals (PRESERVISION AREDS 2 PO) Take by mouth.     Current Facility-Administered Medications on File Prior to Visit  Medication Dose Route Frequency Provider Last Rate Last Admin   denosumab  (PROLIA ) injection 60 mg  60 mg Subcutaneous Once Wilkie Zenon S, PA-C       "

## 2024-04-25 ENCOUNTER — Other Ambulatory Visit: Payer: Self-pay | Admitting: Neurology

## 2024-05-30 ENCOUNTER — Encounter: Payer: Medicare Other | Admitting: Medical

## 2024-09-04 ENCOUNTER — Ambulatory Visit

## 2025-01-29 ENCOUNTER — Ambulatory Visit: Payer: Self-pay
